# Patient Record
Sex: Male | Born: 1970 | Race: Black or African American | Hispanic: No | Smoking: Never smoker
Health system: Southern US, Community
[De-identification: ages and names within clinical notes are randomized; demographics above are authoritative.]

## PROBLEM LIST (undated history)

## (undated) DIAGNOSIS — E119 Type 2 diabetes mellitus without complications: Secondary | ICD-10-CM

## (undated) HISTORY — PX: OTHER SURGICAL HISTORY: SHX169

---

## 2015-10-06 ENCOUNTER — Emergency Department (HOSPITAL_COMMUNITY)
Admission: EM | Admit: 2015-10-06 | Discharge: 2015-10-07 | Disposition: A | Discharge status: Home / Self Care | Payer: Worker's Compensation | Attending: Emergency Medicine | Admitting: Emergency Medicine

## 2015-10-06 ENCOUNTER — Encounter (HOSPITAL_COMMUNITY): Payer: Self-pay | Admitting: *Deleted

## 2015-10-06 DIAGNOSIS — M545 Low back pain: Secondary | ICD-10-CM

## 2015-10-06 DIAGNOSIS — S8991XA Unspecified injury of right lower leg, initial encounter: Secondary | ICD-10-CM | POA: Insufficient documentation

## 2015-10-06 DIAGNOSIS — S4991XA Unspecified injury of right shoulder and upper arm, initial encounter: Secondary | ICD-10-CM | POA: Insufficient documentation

## 2015-10-06 DIAGNOSIS — Y9389 Activity, other specified: Secondary | ICD-10-CM | POA: Insufficient documentation

## 2015-10-06 DIAGNOSIS — E119 Type 2 diabetes mellitus without complications: Secondary | ICD-10-CM | POA: Diagnosis not present

## 2015-10-06 DIAGNOSIS — Y990 Civilian activity done for income or pay: Secondary | ICD-10-CM | POA: Diagnosis not present

## 2015-10-06 DIAGNOSIS — W010XXA Fall on same level from slipping, tripping and stumbling without subsequent striking against object, initial encounter: Secondary | ICD-10-CM | POA: Diagnosis not present

## 2015-10-06 DIAGNOSIS — W19XXXA Unspecified fall, initial encounter: Secondary | ICD-10-CM

## 2015-10-06 DIAGNOSIS — S3992XA Unspecified injury of lower back, initial encounter: Secondary | ICD-10-CM | POA: Insufficient documentation

## 2015-10-06 DIAGNOSIS — Z880 Allergy status to penicillin: Secondary | ICD-10-CM | POA: Insufficient documentation

## 2015-10-06 DIAGNOSIS — Y9289 Other specified places as the place of occurrence of the external cause: Secondary | ICD-10-CM | POA: Insufficient documentation

## 2015-10-06 DIAGNOSIS — M25511 Pain in right shoulder: Secondary | ICD-10-CM

## 2015-10-06 DIAGNOSIS — M25561 Pain in right knee: Secondary | ICD-10-CM

## 2015-10-06 HISTORY — DX: Type 2 diabetes mellitus without complications: E11.9

## 2015-10-06 MED ORDER — IBUPROFEN 400 MG PO TABS
800.0000 mg | ORAL_TABLET | Freq: Once | ORAL | Status: AC
  Administered 2015-10-06: 800 mg via ORAL
  Filled 2015-10-06: qty 2

## 2015-10-06 NOTE — ED Notes (Signed)
Patient stated he slipped on a wet floor at work, grabbed the swinging door with his right hand pulling his shoulder, hit his right knee and c/o lower back pain.  Denies LOC

## 2015-10-06 NOTE — ED Provider Notes (Signed)
CSN: [id number]     Arrival date & time 10/06/15  2250 History  By signing my name below, I, Rosario AdieWilliam Andrew Hiatt, attest that this documentation has been prepared under the direction and in the presence of Cheri FowlerKayla Iniko Robles, PA-C.   Electronically Signed: Rosario AdieWilliam Andrew Hiatt, ED Scribe. 10/06/2015. 11:56 PM.  Chief Complaint  Patient presents with  . Shoulder Pain  . Knee Pain  . Back Pain   The history is provided by the patient. No language interpreter was used.   HPI Comments: Charles Flowers is a 45 y.o. male with a PMHx of DM who presents to the Emergency Department complaining of gradually worsening R shoulder, R knee, and lower back pain s/p fall that occurred 3 hours PTA. Pt states that he fell backwards on a wet floor and tried to catch himself on a stall door and felt a "rip" in his shoulder, and landed on his R knee. No LOC, no head injury. Pt denies numbness or weakness, urinary or bowel incontinence. No OTC medications or home remedies tried PTA.   Past Medical History  Diagnosis Date  . Diabetes mellitus without complication [facility](HCC)    Past Surgical History  Procedure Laterality Date  . Wilms tumor     No family history on file. Social History  Substance Use Topics  . Smoking status: Never Smoker   . Smokeless tobacco: Never Used  . Alcohol Use: No    Review of Systems  Musculoskeletal: Positive for myalgias, back pain and arthralgias.  Neurological: Negative for syncope, weakness and numbness.  All other systems reviewed and are negative.  Allergies  Penicillins  Home Medications   Prior to Admission medications   Medication Sig Start Date End Date Taking? Authorizing Provider  ibuprofen (ADVIL,MOTRIN) 800 MG tablet Take 1 tablet (800 mg total) by mouth 3 (three) times daily. 10/07/15   Aliena Ghrist, PA-C   BP 148/91 mmHg  Pulse 81  Temp(Src) 98 F (36.7 C) (Oral)  Resp 18  Ht 5\' 9"  (1.753 m)  Wt 83.598 kg  BMI 27.20 kg/m2  SpO2 100%   Physical Exam   Constitutional: He is oriented to person, place, and time. He appears well-developed and well-nourished.  Non-toxic appearance. He does not have a sickly appearance. He does not appear ill.  HENT:  Head: Normocephalic and atraumatic.  Mouth/Throat: Oropharynx is clear and moist.  Eyes: Conjunctivae are normal. Pupils are equal, round, and reactive to light.  Neck: Normal range of motion. Neck supple.  No c/t midline tenderness.   Cardiovascular: Normal rate and regular rhythm.   Pulmonary/Chest: Effort normal and breath sounds normal. No accessory muscle usage or stridor. No respiratory distress. He has no wheezes. He has no rhonchi. He has no rales.  Abdominal: Soft. Bowel sounds are normal. He exhibits no distension. There is no tenderness. There is no rebound and no guarding.  Musculoskeletal: Normal range of motion.  No lumbar midline tenderness. FAROM of lumbar spine.  Right shoulder without obvious deformity or swelling.  TTP along AC joint and distal clavicle.  FAROM of shoulder. Right knee with mild tenderness.  FAROM.  No obvious deformity or swelling.   Lymphadenopathy:    He has no cervical adenopathy.  Neurological: He is alert and oriented to person, place, and time.  Speech clear without dysarthria.  Sensation and strength intact bilaterally throughout lower extremities. Gait normal.   Skin: Skin is warm and dry.  Psychiatric: He has a normal mood and affect. His behavior is  normal.    ED Course  Procedures (including critical care time)  DIAGNOSTIC STUDIES: Oxygen Saturation is 100% on RA, normal by my interpretation.   COORDINATION OF CARE: 11:55 PM-Discussed next steps with pt including DG R knee, DG lower back, DG R shoulder, and pain management medications. Pt verbalized understanding and is agreeable with the plan.   Imaging Review Dg Lumbar Spine Complete  10/07/2015  CLINICAL DATA:  Initial evaluation for acute trauma, fall. EXAM: LUMBAR SPINE - COMPLETE 4+  VIEW COMPARISON:  None. FINDINGS: Five non rib-bearing lumbar type vertebral bodies are present. Vertebral bodies are normally aligned with preservation of the normal lumbar lordosis paravertebral body heights preserved. No fracture or malalignment. Visualized sacrum intact. Mild degenerative intervertebral disc space narrowing present at L4-5 and L5-S1. Degenerative facet arthrosis present at L4-5 and L5-S1. No acute soft tissue abnormality. Surgical clips overlie the right lower quadrant. IMPRESSION: 1. No acute fracture or subluxation within the lumbar spine. 2. 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 Electronically Signed   By: Rise Mu M.D.   On: 10/07/2015 00:54   Dg Shoulder Right  10/07/2015  CLINICAL DATA:  Initial evaluation for acute trauma, fall. EXAM: RIGHT SHOULDER - 2+ VIEW COMPARISON:  None. FINDINGS: No acute fracture or dislocation. Humeral head in normal alignment with the glenoid. AC joint approximated. No periarticular calcification. Osseous mineralization normal. The partially visualized right hemi thorax without acute abnormality. IMPRESSION: No acute osseous abnormality about the shoulder. Electronically Signed   By: Rise Mu M.D.   On: 10/07/2015 00:50   Dg Knee Complete 4 Views Right  10/07/2015  CLINICAL DATA:  Initial evaluation for acute trauma, fall. EXAM: RIGHT KNEE - COMPLETE 4+ VIEW COMPARISON:  None. FINDINGS: No acute fracture dislocation. No significant joint effusion. Mild moderate tricompartmental degenerative osteoarthrosis. Scatter chondrocalcinosis noted. Scattered vascular calcifications within the leak. No significant soft tissue swelling. Osseous mineralization normal. IMPRESSION: 1. No acute fracture or dislocation. 2. 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 Electronically Signed   By: Rise Mu M.D.   On: 10/07/2015 00:52    I have personally reviewed and evaluated these images  as part of my medical decision-making.   MDM   Final diagnoses:  Fall, initial encounter  Right shoulder pain  Low back pain without sciatica, unspecified back pain laterality  Right knee pain   Patient presents with right shoulder, low back, and right knee pain after fall from standing today.  VSS, NAD.  Neurovascularly intact.  No back pain red flags.  Plain films negative for acute abnormality.  Ibuprofen for pain.  Follow up [facility]. Discussed return precautions.  Patient agrees and acknowledges the above plan for discharge.   I personally performed the services described in this documentation, which was scribed in my presence. The recorded information has been reviewed and is accurate.     Cheri Fowler, PA-C 10/07/15 0149  Layla Maw Ward, DO 10/07/15 1610

## 2015-10-07 ENCOUNTER — Emergency Department (HOSPITAL_COMMUNITY): Payer: Worker's Compensation

## 2015-10-07 ENCOUNTER — Ambulatory Visit (INDEPENDENT_AMBULATORY_CARE_PROVIDER_SITE_OTHER): Payer: Worker's Compensation | Admitting: Physician Assistant

## 2015-10-07 VITALS — BP 134/84 | HR 104 | Temp 98.0°F | Resp 17 | Ht 68.5 in | Wt 183.0 lb

## 2015-10-07 DIAGNOSIS — S46911D Strain of unspecified muscle, fascia and tendon at shoulder and upper arm level, right arm, subsequent encounter: Secondary | ICD-10-CM

## 2015-10-07 DIAGNOSIS — M545 Low back pain: Secondary | ICD-10-CM | POA: Diagnosis not present

## 2015-10-07 DIAGNOSIS — M25561 Pain in right knee: Secondary | ICD-10-CM | POA: Diagnosis not present

## 2015-10-07 DIAGNOSIS — W19XXXD Unspecified fall, subsequent encounter: Secondary | ICD-10-CM | POA: Diagnosis not present

## 2015-10-07 MED ORDER — IBUPROFEN 800 MG PO TABS: 800.0000 mg | ORAL_TABLET | Freq: Three times a day (TID) | ORAL | Status: DC

## 2015-10-07 NOTE — Discharge Instructions (Signed)

## 2015-10-07 NOTE — Patient Instructions (Addendum)
ICE 3 TIMES DAILY STRETCH 3 TIMES DAILY Shoulder Range of Motion Exercises Shoulder range of motion (ROM) exercises are designed to keep the shoulder moving freely. They are often recommended for people who have shoulder pain. MOVEMENT EXERCISE When you are able, do this exercise 5-6 days per week, or as told by your health care provider. Work toward doing 2 sets of 10 swings. Pendulum Exercise How To Do This Exercise Lying Down 1. Lie face-down on a bed with your abdomen close to the side of the bed. 2. Let your arm hang over the side of the bed. 3. Relax your shoulder, arm, and hand. 4. Slowly and gently swing your arm forward and back. Do not use your neck muscles to swing your arm. They should be relaxed. If you are struggling to swing your arm, have someone gently swing it for you. When you do this exercise for the first time, swing your arm at a 15 degree angle for 15 seconds, or swing your arm 10 times. As pain lessens over time, increase the angle of the swing to 30-45 degrees. 5. Repeat steps 1-4 with the other arm. How To Do This Exercise While Standing 1. Stand next to a sturdy chair or table and hold on to it with your hand.  Bend forward at the waist.  Bend your knees slightly.  Relax your other arm and let it hang limp.  Relax the shoulder blade of the arm that is hanging and let it drop.  While keeping your shoulder relaxed, use body motion to swing your arm in small circles. The first time you do this exercise, swing your arm for about 30 seconds or 10 times. When you do it next time, swing your arm for a little longer.  Stand up tall and relax.  Repeat steps 1-7, this time changing the direction of the circles. 2. Repeat steps 1-8 with the other arm. STRETCHING EXERCISES Do these exercises 3-4 times per day on 5-6 days per week or as told by your health care provider. Work toward holding the stretch for 20 seconds. Stretching Exercise 1 1. Lift your arm straight out  in front of you. 2. Bend your arm 90 degrees at the elbow (right angle) so your forearm goes across your body and looks like the letter "L." 3. Use your other arm to gently pull the elbow forward and across your body. 4. Repeat steps 1-3 with the other arm. Stretching Exercise 2 You will need a towel or rope for this exercise. 1. Bend one arm behind your back with the palm facing outward. 2. Hold a towel with your other hand. 3. Reach the arm that holds the towel above your head, and bend that arm at the elbow. Your wrist should be behind your neck. 4. Use your free hand to grab the free end of the towel. 5. With the higher hand, gently pull the towel up behind you. 6. With the lower hand, pull the towel down behind you. 7. Repeat steps 1-6 with the other arm. STRENGTHENING EXERCISES Do each of these exercises at four different times of day (sessions) every day or as told by your health care provider. To begin with, repeat each exercise 5 times (repetitions). Work toward doing 3 sets of 12 repetitions or as told by your health care provider. Strengthening Exercise 1 You will need a light weight for this activity. As you grow stronger, you may use a heavier weight. 1. Standing with a weight in your hand,  lift your arm straight out to the side until it is at the same height as your shoulder. 2. Bend your arm at 90 degrees so that your fingers are pointing to the ceiling. 3. Slowly raise your hand until your arm is straight up in the air. 4. Repeat steps 1-3 with the other arm. Strengthening Exercise 2 You will need a light weight for this activity. As you grow stronger, you may use a heavier weight. 1. Standing with a weight in your hand, gradually move your straight arm in an arc, starting at your side, then out in front of you, then straight up over your head. 2. Gradually move your other arm in an arc, starting at your side, then out in front of you, then straight up over your  head. 3. Repeat steps 1-2 with the other arm. Strengthening Exercise 3 You will need an elastic band for this activity. As you grow stronger, gradually increase the size of the bands or increase the number of bands that you use at one time. 1. While standing, hold an elastic band in one hand and raise that arm up in the air. 2. With your other hand, pull down the band until that hand is by your side. 3. Repeat steps 1-2 with the other arm.   This information is not intended to replace advice given to you by your health care provider. Make sure you discuss any questions you have with your health care provider.   Document Released: 01/15/2003 Document Revised: 09/02/2014 Document Reviewed: 04/14/2014 Elsevier Interactive Patient Education 2016 [employer]   Back Exercises The following exercises strengthen the muscles that help to support the back. They also help to keep the lower back flexible. Doing these exercises can help to prevent back pain or lessen existing pain. If you have back pain or discomfort, try doing these exercises 2-3 times each day or as told by your health care provider. When the pain goes away, do them once each day, but increase the number of times that you repeat the steps for each exercise (do more repetitions). If you do not have back pain or discomfort, do these exercises once each day or as told by your health care provider. EXERCISES Single Knee to Chest Repeat these steps 3-5 times for each leg: 6. Lie on your back on a firm bed or the floor with your legs extended. 7. Bring one knee to your chest. Your other leg should stay extended and in contact with the floor. 8. Hold your knee in place by grabbing your knee or thigh. 9. Pull on your knee until you feel a gentle stretch in your lower back. 10. Hold the stretch for 10-30 seconds. 11. Slowly release and straighten your leg. Pelvic Tilt Repeat these steps 5-10 times: 3. Lie on your back on a firm bed or the  floor with your legs extended. 4. Bend your knees so they are pointing toward the ceiling and your feet are flat on the floor. 5. Tighten your lower abdominal muscles to press your lower back against the floor. This motion will tilt your pelvis so your tailbone points up toward the ceiling instead of pointing to your feet or the floor. 6. With gentle tension and even breathing, hold this position for 5-10 seconds. Cat-Cow Repeat these steps until your lower back becomes more flexible: 5. Get into a hands-and-knees position on a firm surface. Keep your hands under your shoulders, and keep your knees under your hips. You may place  padding under your knees for comfort. 6. Let your head hang down, and point your tailbone toward the floor so your lower back becomes rounded like the back of a cat. 7. Hold this position for 5 seconds. 8. Slowly lift your head and point your tailbone up toward the ceiling so your back forms a sagging arch like the back of a cow. 9. Hold this position for 5 seconds. Press-Ups Repeat these steps 5-10 times: 8. Lie on your abdomen (face-down) on the floor. 9. Place your palms near your head, about shoulder-width apart. 10. While you keep your back as relaxed as possible and keep your hips on the floor, slowly straighten your arms to raise the top half of your body and lift your shoulders. Do not use your back muscles to raise your upper torso. You may adjust the placement of your hands to make yourself more comfortable. 11. Hold this position for 5 seconds while you keep your back relaxed. 12. Slowly return to lying flat on the floor. Bridges Repeat these steps 10 times: 5. Lie on your back on a firm surface. 6. Bend your knees so they are pointing toward the ceiling and your feet are flat on the floor. 7. Tighten your buttocks muscles and lift your buttocks off of the floor until your waist is at almost the same height as your knees. You should feel the muscles working  in your buttocks and the back of your thighs. If you do not feel these muscles, slide your feet 1-2 inches farther away from your buttocks. 8. Hold this position for 3-5 seconds. 9. Slowly lower your hips to the starting position, and allow your buttocks muscles to relax completely. If this exercise is too easy, try doing it with your arms crossed over your chest. Abdominal Crunches Repeat these steps 5-10 times: 4. Lie on your back on a firm bed or the floor with your legs extended. 5. Bend your knees so they are pointing toward the ceiling and your feet are flat on the floor. 6. Cross your arms over your chest. 7. Tip your chin slightly toward your chest without bending your neck. 8. Tighten your abdominal muscles and slowly raise your trunk (torso) high enough to lift your shoulder blades a tiny bit off of the floor. Avoid raising your torso higher than that, because it can put too much stress on your low back and it does not help to strengthen your abdominal muscles. 9. Slowly return to your starting position. Back Lifts Repeat these steps 5-10 times: 4. Lie on your abdomen (face-down) with your arms at your sides, and rest your forehead on the floor. 5. Tighten the muscles in your legs and your buttocks. 6. Slowly lift your chest off of the floor while you keep your hips pressed to the floor. Keep the back of your head in line with the curve in your back. Your eyes should be looking at the floor. 7. Hold this position for 3-5 seconds. 8. Slowly return to your starting position. SEEK MEDICAL CARE IF:  Your back pain or discomfort gets much worse when you do an exercise.  Your back pain or discomfort does not lessen within 2 hours after you exercise. If you have any of these problems, stop doing these exercises right away. Do not do them again unless your health care provider says that you can. SEEK IMMEDIATE MEDICAL CARE IF:  You develop sudden, severe back pain. If this happens, stop  doing the exercises right away.  Do not do them again unless your health care provider says that you can.   This information is not intended to replace advice given to you by your health care provider. Make sure you discuss any questions you have with your health care provider.   Document Released: 05/26/2004 Document Revised: 01/07/2015 Document Reviewed: 06/12/2014 Elsevier Interactive Patient Education 2016 [employer].      IF you received an x-ray today, you will receive an invoice from [facility]. Please contact [facility] at [phone] with questions or concerns regarding your invoice.   IF you received labwork today, you will receive an invoice from [employer]. Please contact [facility] at [phone] with questions or concerns regarding your invoice.   Our billing staff will not be able to assist you with questions regarding bills from these companies.  You will be contacted with the lab results as soon as they are available. The fastest way to get your results is to activate your My Chart account. Instructions are located on the last page of this paperwork. If you have not heard from [place] regarding the results in 2 weeks, please contact this office.

## 2015-10-07 NOTE — ED Notes (Signed)
Ice applied to R shoulder  

## 2015-10-07 NOTE — ED Notes (Signed)
Patient verbalized understanding of discharge instructions and denies any further needs or questions at this time. VS stable. Patient ambulatory with steady gait, declined wheelchair.  

## 2015-10-07 NOTE — ED Notes (Signed)
PA-C to see and assess patient before RN assessment. See PA note. 

## 2015-10-26 NOTE — Progress Notes (Signed)
Charles Flowers 1970-11-01 45 y.o.   Chief Complaint  Patient presents with  . Shoulder Pain    right   . Knee Pain  . Back Pain     Date of Injury: 10/06/2015  History of Present Illness:  Presents for evaluation of work-related complaint.  Patient is here for follow up and form information with a cc of right shoulder, right knee, and back pain after a fall backwards.  He slipped on a wet floor and attempted to reach for a door, and felt his shoulder pull.  He went to the ED yesterday and treated with ibuprofen.  He was also given complete restriction from work by the ED. Patient reports that he continues to have shoulder pain, right knee pain, and right low back pain.  There is no radiating numbness or tingling.  Shoulder pain without weakness.  He is able to move the shoulder though very painful.  More at the back of the shoulder.  He also has knee pain to the lateral anterior right knee.  Localized pain.    ROS ROS otherwise unremarkable unless listed above.   Allergies  Allergen Reactions  . Penicillins     Told not to take      Current medications reviewed and updated. Past medical history, family history, social history have been reviewed and updated.   Physical Exam  Constitutional: He is well-developed, well-nourished, and in no distress. No distress.  Eyes: Conjunctivae are normal. Pupils are equal, round, and reactive to light.  Cardiovascular: Normal rate.   Pulmonary/Chest: Effort normal. No respiratory distress. He has no wheezes.  Musculoskeletal:       Right shoulder: He exhibits decreased range of motion (external rotation with pain incited to the posteroir shoulder) and tenderness (right scapula). He exhibits no swelling, no pain and no spasm.       Right knee: He exhibits normal range of motion, no swelling, no LCL laxity, normal patellar mobility and no MCL laxity. Tenderness (anterior just superior to medial patella) found. No medial joint line, no  lateral joint line and no patellar tendon tenderness noted.       Lumbar back: He exhibits tenderness (spinous lumbar tenderness with adjacent musculature of right side.). He exhibits normal range of motion, no swelling and no spasm.  Skin: He is not diaphoretic.     Assessment and Plan: 45 year old male is here today for right shoulder and knee pain, and right low back pain.   We will keep the restrictions suggested by the ED.  Advised to rtc in 6 days for follow up as instructed in letter. Fall, subsequent encounter  Shoulder strain, right, subsequent encounter  Right knee pain  Right low back pain, with sciatica presence unspecified  Charles PlattStephanie English, PA-C [facility] 6/26/201710:25 AM

## 2016-03-10 ENCOUNTER — Encounter (HOSPITAL_COMMUNITY): Payer: Self-pay | Admitting: *Deleted

## 2016-03-10 ENCOUNTER — Emergency Department (HOSPITAL_COMMUNITY)
Admission: EM | Admit: 2016-03-10 | Discharge: 2016-03-10 | Disposition: A | Discharge status: Home / Self Care | Payer: 59 | Attending: Emergency Medicine | Admitting: Emergency Medicine

## 2016-03-10 ENCOUNTER — Emergency Department (HOSPITAL_COMMUNITY): Payer: 59

## 2016-03-10 DIAGNOSIS — Z79899 Other long term (current) drug therapy: Secondary | ICD-10-CM | POA: Insufficient documentation

## 2016-03-10 DIAGNOSIS — E119 Type 2 diabetes mellitus without complications: Secondary | ICD-10-CM | POA: Insufficient documentation

## 2016-03-10 DIAGNOSIS — J40 Bronchitis, not specified as acute or chronic: Secondary | ICD-10-CM | POA: Insufficient documentation

## 2016-03-10 LAB — I-STAT TROPONIN, ED
TROPONIN I, POC: 0.01 ng/mL (ref 0.00–0.08)
TROPONIN I, POC: 0 ng/mL (ref 0.00–0.08)

## 2016-03-10 LAB — BASIC METABOLIC PANEL
ANION GAP: 9 (ref 5–15)
BUN: 20 mg/dL (ref 6–20)
CALCIUM: 9.1 mg/dL (ref 8.9–10.3)
CHLORIDE: 103 mmol/L (ref 101–111)
CO2: 24 mmol/L (ref 22–32)
Creatinine, Ser: 1.4 mg/dL — ABNORMAL HIGH (ref 0.61–1.24)
GFR calc non Af Amer: 60 mL/min — ABNORMAL LOW (ref >60)
Glucose, Bld: 195 mg/dL — ABNORMAL HIGH (ref 65–99)
Potassium: 4.5 mmol/L (ref 3.5–5.1)
Sodium: 136 mmol/L (ref 135–145)

## 2016-03-10 LAB — CBC
HCT: 36.2 % — ABNORMAL LOW (ref 39.0–52.0)
HEMOGLOBIN: 12.1 g/dL — AB (ref 13.0–17.0)
MCH: 27.4 pg (ref 26.0–34.0)
MCHC: 33.4 g/dL (ref 30.0–36.0)
MCV: 81.9 fL (ref 78.0–100.0)
Platelets: 156 10*3/uL (ref 150–400)
RBC: 4.42 MIL/uL (ref 4.22–5.81)
RDW: 12.4 % (ref 11.5–15.5)
WBC: 3.9 10*3/uL — AB (ref 4.0–10.5)

## 2016-03-10 MED ORDER — PREDNISONE 20 MG PO TABS: ORAL_TABLET | ORAL | 0 refills | Status: DC

## 2016-03-10 MED ORDER — ALBUTEROL SULFATE HFA 108 (90 BASE) MCG/ACT IN AERS
2.0000 | INHALATION_SPRAY | RESPIRATORY_TRACT | Status: DC | PRN
  Administered 2016-03-10: 2 via RESPIRATORY_TRACT
  Filled 2016-03-10: qty 6.7

## 2016-03-10 MED ORDER — AEROCHAMBER PLUS W/MASK MISC
1.0000 | Freq: Once | Status: AC
Start: 2016-03-10 — End: 2016-03-10
  Administered 2016-03-10: 1
  Filled 2016-03-10: qty 1

## 2016-03-10 NOTE — ED Notes (Signed)
Pt ambulated in hallway with pulse ox with no complaints of SOB/dizziness/pain. O2 sats 100% on RA with ambulation.

## 2016-03-10 NOTE — ED Notes (Signed)
Phlebotomy at bedside.

## 2016-03-10 NOTE — ED Provider Notes (Signed)
[facility]-EMERGENCY DEPT Provider Note   CSN: [id number] Arrival date & time: 03/10/16  1049     History   Chief Complaint Chief Complaint  Patient presents with  . Chest Pain    HPI Charles Flowers is a 45 y.o. male.  HPI   45 year old male with history of diabetes presenting to the ED with complaints of chest pain. Patient states after he got off of work at 2 AM last night he notice an acute onset of chest tightness which has been intermittent but has not fully resolved since. Symptoms more prominent when he take a deep breath. He went to sleep, woke up and noticed that the pain still there. Pain is primarily to his upper right chest. He denies any associated lightheadedness, dizziness, diaphoresis, nausea. No radicular pain. He denies any fever, chills, headache, back pain, abdominal pain, focal numbness or weakness, or rash. Denies any recent strenuous activities or heavy lifting. No prior history of PE or DVT, no recent surgery, prolonged bed rest, unilateral leg swelling or calf pain, active cancer or hemoptysis. He is an insulin-dependent diabetes have been compliant with his medication. He denies any tobacco or alcohol use and no recreational drug use. Stress level is the same.  Past Medical History:  Diagnosis Date  . Diabetes mellitus without complication (HCC)     There are no active problems to display for this patient.   Past Surgical History:  Procedure Laterality Date  . wilms tumor         Home Medications    Prior to Admission medications   Medication Sig Start Date End Date Taking? Authorizing Provider  gabapentin (NEURONTIN) 300 MG capsule Take 300 mg by mouth as needed (pain).   Yes Historical Provider, MD  lisinopril (PRINIVIL,ZESTRIL) 2.5 MG tablet Take 2.5 mg by mouth daily.   Yes Historical Provider, MD  ibuprofen (ADVIL,MOTRIN) 800 MG tablet Take 1 tablet (800 mg total) by mouth 3 (three) times daily. Patient not taking: Reported on 03/10/2016 10/07/15    Cheri FowlerKayla Rose, PA-C    Family History No family history on file.  Social History Social History  Substance Use Topics  . Smoking status: Never Smoker  . Smokeless tobacco: Never Used  . Alcohol use No     Allergies   Penicillins   Review of Systems Review of Systems  All other systems reviewed and are negative.    Physical Exam Updated Vital Signs BP 154/82   Pulse 91   Temp 98.2 F (36.8 C) (Oral)   Resp 16   Ht 5\' 9"  (1.753 m)   Wt 87.1 kg   SpO2 100%   BMI 28.35 kg/m   Physical Exam  Constitutional: He is oriented to person, place, and time. He appears well-developed and well-nourished. No distress.  HENT:  Head: Atraumatic.  Eyes: Conjunctivae are normal.  Neck: Neck supple. No JVD present.  Cardiovascular: Normal rate, regular rhythm and intact distal pulses.   Pulmonary/Chest: Effort normal and breath sounds normal. No respiratory distress. He has no wheezes. He exhibits tenderness (Mild tenderness to right upper chest on palpation without crepitus or emphysema. No overlying skin changes.).  Abdominal: Soft. He exhibits no distension. There is no tenderness.  Musculoskeletal: Normal range of motion. He exhibits no edema.  Bilateral lower extremities without palpable cords, erythema, or edema.  Neurological: He is alert and oriented to person, place, and time.  Skin: No rash noted.  Psychiatric: He has a normal mood and affect.  Nursing note and  vitals reviewed.    ED Treatments / Results  Labs (all labs ordered are listed, but only abnormal results are displayed) Labs Reviewed  BASIC METABOLIC PANEL - Abnormal; Notable for the following:       Result Value   Glucose, Bld 195 (*)    Creatinine, Ser 1.40 (*)    GFR calc non Af Amer 60 (*)    All other components within normal limits  CBC - Abnormal; Notable for the following:    WBC 3.9 (*)    Hemoglobin 12.1 (*)    HCT 36.2 (*)    All other components within normal limits  I-STAT TROPOININ, ED    I-STAT TROPOININ, ED    EKG  EKG Interpretation  Date/Time:  Thursday March 10 2016 11:09:17 EST Ventricular Rate:  68 PR Interval:  154 QRS Duration: 76 QT Interval:  358 QTC Calculation: 380 R Axis:   79 Text Interpretation:  Normal sinus rhythm Normal ECG Confirmed by MESNER MD, Barbara CowerJASON [phone]([id number]) on 03/10/2016 12:41:31 PM       Radiology Dg Chest 2 View  Result Date: 03/10/2016 CLINICAL DATA:  Right-sided chest pain and tightness since earlier today with intermittent shortness of breath. No cardiopulmonary history. Nonsmoker. History of diabetes. EXAM: CHEST  2 VIEW COMPARISON:  None in PACs FINDINGS: The right hemidiaphragm is mildly elevated. The interstitial markings are both lungs are coarse. There is no alveolar infiltrate or pleural effusion. The heart and pulmonary vascularity are normal. The mediastinum is normal in width. The bony thorax exhibits no acute abnormality. IMPRESSION: Bronchitic changes either acute or chronic. No pneumonia nor CHF. No pneumothorax. Mild right hemidiaphragm elevation. Electronically Signed   By: David  SwazilandJordan M.D.   On: 03/10/2016 11:53    Procedures Procedures (including critical care time)  Medications Ordered in ED Medications  albuterol (PROVENTIL HFA;VENTOLIN HFA) 108 (90 Base) MCG/ACT inhaler 2 puff (2 puffs Inhalation Given 03/10/16 1451)  aerochamber plus with mask device 1 each (1 each Other Given 03/10/16 1451)     Initial Impression / Assessment and Plan / ED Course  I have reviewed the triage vital signs and the nursing notes.  Pertinent labs & imaging results that were available during my care of the patient were reviewed by me and considered in my medical decision making (see chart for details).  Clinical Course    BP 145/90   Pulse 66   Temp 98.2 F (36.8 C) (Oral)   Resp 23   Ht 5\' 9"  (1.753 m)   Wt 87.1 kg   SpO2 99%   BMI 28.35 kg/m    Final Clinical Impressions(s) / ED Diagnoses   Final diagnoses:   Bronchitis    New Prescriptions New Prescriptions   PREDNISONE (DELTASONE) 20 MG TABLET    2 tabs po daily x 4 days   2:26 PM Patient here with reproducible right upper chest wall tenderness atypical for ACS. PERC negative, doubt PE.   3:58 PM EKG without acute ischemic changes. Delta troponin is negative. Labs are reassuring. Chest x-ray with signs of suspect bronchitis. Patient given albuterol inhaler which provide some relief of his chest pressure. He will be discharged with a course of steroid, albuterol inhaler to use as needed. Return precaution discussed. Work note provided as requested.   Fayrene HelperBowie Tysean Vandervliet, PA-C 03/10/16 1600    Arby BarretteMarcy Pfeiffer, MD 03/15/16 [phone]

## 2016-03-10 NOTE — Discharge Instructions (Signed)
Your chest tightness is likely due to bronchitis.  Use albuterol inhaler 2 puffs every 4 hours as needed  Take steroid as prescribed.  Return to the ER if your condition worsen or if you have other concerns

## 2016-03-10 NOTE — ED Triage Notes (Signed)
Pt reports chest pain that started at 2am after getting off work. Pt states that pain continued and woke him from sleep. Pt is upper rt chest. Denies associated symptoms.

## 2016-10-07 ENCOUNTER — Emergency Department (HOSPITAL_COMMUNITY)
Admission: EM | Admit: 2016-10-07 | Discharge: 2016-10-08 | Disposition: A | Discharge status: Home / Self Care | Payer: Worker's Compensation | Attending: Emergency Medicine | Admitting: Emergency Medicine

## 2016-10-07 ENCOUNTER — Encounter (HOSPITAL_COMMUNITY): Payer: Self-pay

## 2016-10-07 DIAGNOSIS — Y929 Unspecified place or not applicable: Secondary | ICD-10-CM | POA: Diagnosis not present

## 2016-10-07 DIAGNOSIS — E119 Type 2 diabetes mellitus without complications: Secondary | ICD-10-CM | POA: Insufficient documentation

## 2016-10-07 DIAGNOSIS — X500XXA Overexertion from strenuous movement or load, initial encounter: Secondary | ICD-10-CM | POA: Diagnosis not present

## 2016-10-07 DIAGNOSIS — Y990 Civilian activity done for income or pay: Secondary | ICD-10-CM | POA: Diagnosis not present

## 2016-10-07 DIAGNOSIS — Z79899 Other long term (current) drug therapy: Secondary | ICD-10-CM | POA: Insufficient documentation

## 2016-10-07 DIAGNOSIS — M545 Low back pain, unspecified: Secondary | ICD-10-CM

## 2016-10-07 DIAGNOSIS — Y9389 Activity, other specified: Secondary | ICD-10-CM | POA: Diagnosis not present

## 2016-10-07 NOTE — ED Triage Notes (Addendum)
Pt reports 8/10 Lower back pain that began while at work this evening. Pt denies recent traumatic injury or fall. Pt A+OX4, speaking in complete sentences, ambulatory to triage. Pt also reports he would like Workers Comp completed.

## 2016-10-08 MED ORDER — METHOCARBAMOL 500 MG PO TABS: 500.0000 mg | ORAL_TABLET | Freq: Two times a day (BID) | ORAL | 0 refills | Status: DC

## 2016-10-08 MED ORDER — NAPROXEN 500 MG PO TABS: 500.0000 mg | ORAL_TABLET | Freq: Two times a day (BID) | ORAL | 0 refills | Status: DC

## 2016-10-08 MED ORDER — NAPROXEN 500 MG PO TABS
500.0000 mg | ORAL_TABLET | Freq: Once | ORAL | Status: AC
  Administered 2016-10-08: 500 mg via ORAL
  Filled 2016-10-08: qty 1

## 2016-10-08 NOTE — ED Provider Notes (Signed)
[facility]-EMERGENCY DEPT Provider Note   CSN: [id number] Arrival date & time: 10/07/16  2336     History   Chief Complaint Chief Complaint  Patient presents with  . Back Pain    HPI Roger Kettles is a 46 y.o. male.  HPI   Patient is a 46 year old male with history of diabetes who presents to the ED with complaint of low back pain. Patient reports earlier this evening while he was at work he was lifting a heavy carpet roller another coworker and he states after he first tried to lift the object he had sudden onset of cramping spasming pain across his lower back. Patient reports having constant pain since the injury and notes pain is worse with movement or bending. Denies taking any medications prior to arrival. Pt denies fever, numbness, tingling, saddle anesthesia, loss of bowel or bladder, weakness, IVDU, cancer or recent spinal manipulation. Denies history of chronic back pain or previous back injuries. Patient denies any recent fall.  Past Medical History:  Diagnosis Date  . Diabetes mellitus without complication (HCC)     There are no active problems to display for this patient.   Past Surgical History:  Procedure Laterality Date  . wilms tumor         Home Medications    Prior to Admission medications   Medication Sig Start Date End Date Taking? Authorizing Provider  gabapentin (NEURONTIN) 300 MG capsule Take 300 mg by mouth as needed (pain).    [provider]  ibuprofen (ADVIL,MOTRIN) 800 MG tablet Take 1 tablet (800 mg total) by mouth 3 (three) times daily. Patient not taking: Reported on 03/10/2016 10/07/15   Cheri Fowler, PA-C  lisinopril (PRINIVIL,ZESTRIL) 2.5 MG tablet Take 2.5 mg by mouth daily.    [provider]  methocarbamol (ROBAXIN) 500 MG tablet Take 1 tablet (500 mg total) by mouth 2 (two) times daily. 10/08/16   Barrett Henle, PA-C  naproxen (NAPROSYN) 500 MG tablet Take 1 tablet (500 mg total) by mouth 2 (two) times daily.  10/08/16   Barrett Henle, PA-C  predniSONE (DELTASONE) 20 MG tablet 2 tabs po daily x 4 days 03/10/16   Fayrene Helper, PA-C    Family History History reviewed. No pertinent family history.  Social History Social History  Substance Use Topics  . Smoking status: Never Smoker  . Smokeless tobacco: Never Used  . Alcohol use No     Allergies   Penicillins   Review of Systems Review of Systems  Musculoskeletal: Positive for back pain.  All other systems reviewed and are negative.    Physical Exam Updated Vital Signs BP (!) 166/87 (BP Location: Left Arm)   Pulse 71   Temp 98.1 F (36.7 C) (Oral)   Resp 16   Ht 5\' 9"  (1.753 m)   Wt 96.6 kg (212 lb 15.4 oz)   SpO2 98%   BMI 31.45 kg/m   Physical Exam  Constitutional: He is oriented to person, place, and time. He appears well-developed and well-nourished. No distress.  HENT:  Head: Normocephalic and atraumatic.  Eyes: Conjunctivae and EOM are normal. Right eye exhibits no discharge. Left eye exhibits no discharge. No scleral icterus.  Neck: Normal range of motion. Neck supple.  Cardiovascular: Normal rate, regular rhythm, normal heart sounds and intact distal pulses.   Pulmonary/Chest: Effort normal and breath sounds normal. No respiratory distress. He has no wheezes. He has no rales. He exhibits no tenderness.  Abdominal: Soft. He exhibits no distension. There  is no tenderness.  Musculoskeletal: Normal range of motion. He exhibits tenderness. He exhibits no edema or deformity.  No midline C, T, or L tenderness. Mild TTP over bilateral lumbar paraspinal muscles. Full range of motion of neck and back. Full range of motion of bilateral upper and lower extremities, with 5/5 strength. Sensation intact. 2+ radial and PT pulses. Cap refill <2 seconds. Patient able to stand and ambulate without assistance.    Neurological: He is alert and oriented to person, place, and time. He has normal strength. He displays normal reflexes.  No sensory deficit.  Skin: Skin is warm and dry. He is not diaphoretic.  Nursing note and vitals reviewed.    ED Treatments / Results  Labs (all labs ordered are listed, but only abnormal results are displayed) Labs Reviewed - No data to display  EKG  EKG Interpretation None       Radiology No results found.  Procedures Procedures (including critical care time)  Medications Ordered in ED Medications  naproxen (NAPROSYN) tablet 500 mg (not administered)     Initial Impression / Assessment and Plan / ED Course  I have reviewed the triage vital signs and the nursing notes.  Pertinent labs & imaging results that were available during my care of the patient were reviewed by me and considered in my medical decision making (see chart for details).     Patient with back pain after lifting heavy object at work earlier this evening.  No neurological deficits and normal neuro exam. Mild TTP over lumbar paraspinal muscles. No midlines spinal tenderness. VSS. Patient can walk but states is painful.  No loss of bowel or bladder control.  No concern for cauda equina.  No fever, night sweats, weight loss, h/o cancer, IVDU.  RICE protocol and pain medicine indicated and discussed with patient.  Advised patient to follow up with PCP as needed. Discussed return precautions.  Final Clinical Impressions(s) / ED Diagnoses   Final diagnoses:  Acute bilateral low back pain without sciatica    New Prescriptions New Prescriptions   METHOCARBAMOL (ROBAXIN) 500 MG TABLET    Take 1 tablet (500 mg total) by mouth 2 (two) times daily.   NAPROXEN (NAPROSYN) 500 MG TABLET    Take 1 tablet (500 mg total) by mouth 2 (two) times daily.     Barrett Henleadeau, Nicole Elizabeth, PA-C 10/08/16 0015    Dione BoozeGlick, David, MD 10/08/16 [phone]

## 2016-10-08 NOTE — Discharge Instructions (Signed)
Take your medications as prescribed. You may also apply ice and/or heat to affected area for 15-20 minutes 3-4 times daily for additional pain relief. I recommend refraining from doing any heavy lifting, squatting or repetitive movements that exacerbate her symptoms for the next few days. °Follow-up with your primary care provider in the next week if her symptoms have not improved. °Please return to the Emergency Department if symptoms worsen or new onset of denies fever, numbness, tingling, groin anesthesia, loss of bowel or bladder, weakness, chest pain, abdominal pain, vomiting. °

## 2017-02-02 ENCOUNTER — Emergency Department (HOSPITAL_COMMUNITY): Payer: Self-pay

## 2017-02-02 ENCOUNTER — Encounter (HOSPITAL_COMMUNITY): Payer: Self-pay | Admitting: Emergency Medicine

## 2017-02-02 ENCOUNTER — Observation Stay (HOSPITAL_COMMUNITY)
Admission: EM | Admit: 2017-02-02 | Discharge: 2017-02-03 | Disposition: A | Discharge status: Home / Self Care | Payer: Self-pay | Attending: Internal Medicine | Admitting: Internal Medicine

## 2017-02-02 ENCOUNTER — Observation Stay (HOSPITAL_COMMUNITY): Payer: Self-pay

## 2017-02-02 DIAGNOSIS — E1122 Type 2 diabetes mellitus with diabetic chronic kidney disease: Secondary | ICD-10-CM

## 2017-02-02 DIAGNOSIS — I130 Hypertensive heart and chronic kidney disease with heart failure and stage 1 through stage 4 chronic kidney disease, or unspecified chronic kidney disease: Secondary | ICD-10-CM | POA: Insufficient documentation

## 2017-02-02 DIAGNOSIS — G459 Transient cerebral ischemic attack, unspecified: Principal | ICD-10-CM | POA: Diagnosis present

## 2017-02-02 DIAGNOSIS — I5032 Chronic diastolic (congestive) heart failure: Secondary | ICD-10-CM | POA: Insufficient documentation

## 2017-02-02 DIAGNOSIS — Z794 Long term (current) use of insulin: Secondary | ICD-10-CM | POA: Insufficient documentation

## 2017-02-02 DIAGNOSIS — Z85528 Personal history of other malignant neoplasm of kidney: Secondary | ICD-10-CM | POA: Insufficient documentation

## 2017-02-02 DIAGNOSIS — Z880 Allergy status to penicillin: Secondary | ICD-10-CM | POA: Insufficient documentation

## 2017-02-02 DIAGNOSIS — N179 Acute kidney failure, unspecified: Secondary | ICD-10-CM | POA: Insufficient documentation

## 2017-02-02 DIAGNOSIS — N183 Chronic kidney disease, stage 3 unspecified: Secondary | ICD-10-CM

## 2017-02-02 DIAGNOSIS — E785 Hyperlipidemia, unspecified: Secondary | ICD-10-CM

## 2017-02-02 DIAGNOSIS — I43 Cardiomyopathy in diseases classified elsewhere: Secondary | ICD-10-CM | POA: Insufficient documentation

## 2017-02-02 LAB — ECHOCARDIOGRAM COMPLETE
Height: 69 in
WEIGHTICAEL: 3548.52 [oz_av]

## 2017-02-02 LAB — CREATININE, SERUM
Creatinine, Ser: 1.92 mg/dL — ABNORMAL HIGH (ref 0.61–1.24)
GFR calc non Af Amer: 41 mL/min — ABNORMAL LOW (ref >60)
GFR, EST AFRICAN AMERICAN: 47 mL/min — AB (ref >60)

## 2017-02-02 LAB — URINALYSIS, ROUTINE W REFLEX MICROSCOPIC
BILIRUBIN URINE: NEGATIVE
Glucose, UA: >=500 mg/dL — AB
KETONES UR: NEGATIVE mg/dL
LEUKOCYTES UA: NEGATIVE
NITRITE: NEGATIVE
Protein, ur: >=300 mg/dL — AB
SPECIFIC GRAVITY, URINE: 1.014 (ref 1.005–1.030)
SQUAMOUS EPITHELIAL / LPF: NONE SEEN
pH: 5 (ref 5.0–8.0)

## 2017-02-02 LAB — BASIC METABOLIC PANEL
ANION GAP: 7 (ref 5–15)
BUN: 20 mg/dL (ref 6–20)
CO2: 27 mmol/L (ref 22–32)
Calcium: 8.6 mg/dL — ABNORMAL LOW (ref 8.9–10.3)
Chloride: 103 mmol/L (ref 101–111)
Creatinine, Ser: 2.11 mg/dL — ABNORMAL HIGH (ref 0.61–1.24)
GFR calc non Af Amer: 36 mL/min — ABNORMAL LOW (ref >60)
GFR, EST AFRICAN AMERICAN: 42 mL/min — AB (ref >60)
Glucose, Bld: 257 mg/dL — ABNORMAL HIGH (ref 65–99)
Potassium: 4 mmol/L (ref 3.5–5.1)
SODIUM: 137 mmol/L (ref 135–145)

## 2017-02-02 LAB — I-STAT TROPONIN, ED: TROPONIN I, POC: 0.03 ng/mL (ref 0.00–0.08)

## 2017-02-02 LAB — CBC
HEMATOCRIT: 39.7 % (ref 39.0–52.0)
Hemoglobin: 13.5 g/dL (ref 13.0–17.0)
MCH: 27.6 pg (ref 26.0–34.0)
MCHC: 34 g/dL (ref 30.0–36.0)
MCV: 81 fL (ref 78.0–100.0)
PLATELETS: 159 10*3/uL (ref 150–400)
RBC: 4.9 MIL/uL (ref 4.22–5.81)
RDW: 12.3 % (ref 11.5–15.5)
WBC: 4.5 10*3/uL (ref 4.0–10.5)

## 2017-02-02 LAB — CBC WITH DIFFERENTIAL/PLATELET
BASOS PCT: 0 %
Basophils Absolute: 0 10*3/uL (ref 0.0–0.1)
EOS ABS: 0.2 10*3/uL (ref 0.0–0.7)
Eosinophils Relative: 5 %
HCT: 36.3 % — ABNORMAL LOW (ref 39.0–52.0)
HEMOGLOBIN: 12.3 g/dL — AB (ref 13.0–17.0)
LYMPHS ABS: 1.9 10*3/uL (ref 0.7–4.0)
Lymphocytes Relative: 44 %
MCH: 26.8 pg (ref 26.0–34.0)
MCHC: 33.9 g/dL (ref 30.0–36.0)
MCV: 79.1 fL (ref 78.0–100.0)
Monocytes Absolute: 0.3 10*3/uL (ref 0.1–1.0)
Monocytes Relative: 8 %
Neutro Abs: 1.8 10*3/uL (ref 1.7–7.7)
Neutrophils Relative %: 43 %
Platelets: 164 10*3/uL (ref 150–400)
RBC: 4.59 MIL/uL (ref 4.22–5.81)
RDW: 12.3 % (ref 11.5–15.5)
WBC: 4.2 10*3/uL (ref 4.0–10.5)

## 2017-02-02 LAB — GLUCOSE, CAPILLARY
GLUCOSE-CAPILLARY: 209 mg/dL — AB (ref 65–99)
Glucose-Capillary: 234 mg/dL — ABNORMAL HIGH (ref 65–99)
Glucose-Capillary: 225 mg/dL — ABNORMAL HIGH (ref 65–99)

## 2017-02-02 LAB — LACTATE DEHYDROGENASE: LDH: 206 U/L — AB (ref 98–192)

## 2017-02-02 MED ORDER — STROKE: EARLY STAGES OF RECOVERY BOOK
Freq: Once | Status: DC
  Filled 2017-02-02: qty 1

## 2017-02-02 MED ORDER — SODIUM CHLORIDE 0.9 % IV SOLN: INTRAVENOUS | Status: DC

## 2017-02-02 MED ORDER — ACETAMINOPHEN 160 MG/5ML PO SOLN: 650.0000 mg | ORAL | Status: DC | PRN

## 2017-02-02 MED ORDER — ENOXAPARIN SODIUM 40 MG/0.4ML ~~LOC~~ SOLN
40.0000 mg | SUBCUTANEOUS | Status: DC
  Administered 2017-02-02: 40 mg via SUBCUTANEOUS
  Filled 2017-02-02 (×2): qty 0.4

## 2017-02-02 MED ORDER — SENNOSIDES-DOCUSATE SODIUM 8.6-50 MG PO TABS: 1.0000 | ORAL_TABLET | Freq: Every evening | ORAL | Status: DC | PRN

## 2017-02-02 MED ORDER — FUROSEMIDE 10 MG/ML IJ SOLN
40.0000 mg | Freq: Once | INTRAMUSCULAR | Status: AC
  Administered 2017-02-02: 40 mg via INTRAVENOUS
  Filled 2017-02-02: qty 4

## 2017-02-02 MED ORDER — HYDRALAZINE HCL 25 MG PO TABS
25.0000 mg | ORAL_TABLET | Freq: Four times a day (QID) | ORAL | Status: DC | PRN
  Administered 2017-02-02 – 2017-02-03 (×2): 25 mg via ORAL
  Filled 2017-02-02 (×2): qty 1

## 2017-02-02 MED ORDER — AMLODIPINE BESYLATE 10 MG PO TABS
10.0000 mg | ORAL_TABLET | Freq: Every day | ORAL | Status: DC
  Administered 2017-02-02 – 2017-02-03 (×2): 10 mg via ORAL
  Filled 2017-02-02 (×2): qty 1

## 2017-02-02 MED ORDER — ACETAMINOPHEN 500 MG PO TABS
1000.0000 mg | ORAL_TABLET | Freq: Once | ORAL | Status: AC
  Administered 2017-02-02: 1000 mg via ORAL
  Filled 2017-02-02: qty 2

## 2017-02-02 MED ORDER — ACETAMINOPHEN 325 MG PO TABS: 650.0000 mg | ORAL_TABLET | ORAL | Status: DC | PRN

## 2017-02-02 MED ORDER — INSULIN ASPART 100 UNIT/ML ~~LOC~~ SOLN
0.0000 [IU] | Freq: Three times a day (TID) | SUBCUTANEOUS | Status: DC
  Administered 2017-02-02 – 2017-02-03 (×3): 3 [IU] via SUBCUTANEOUS
  Administered 2017-02-03: 7 [IU] via SUBCUTANEOUS

## 2017-02-02 MED ORDER — ACETAMINOPHEN 650 MG RE SUPP: 650.0000 mg | RECTAL | Status: DC | PRN

## 2017-02-02 MED ORDER — ASPIRIN EC 81 MG PO TBEC
81.0000 mg | DELAYED_RELEASE_TABLET | Freq: Every day | ORAL | Status: DC
  Administered 2017-02-02 – 2017-02-03 (×2): 81 mg via ORAL
  Filled 2017-02-02 (×2): qty 1

## 2017-02-02 NOTE — ED Notes (Signed)
EKG given to EDP,Nanavati,MD., for review. 

## 2017-02-02 NOTE — ED Provider Notes (Signed)
[facility]-EMERGENCY DEPT Provider Note   CSN: [id number] Arrival date & time: 02/02/17  0229     History   Chief Complaint Chief Complaint  Patient presents with  . Hypertension    HPI Charles Flowers is a 46 y.o. male.  HPI Patient with history of diabetes comes in with chief complaint of headache and altered mental status. Patient reports that when he left work at 12:30 AM he started noticing some right-sided weakness. Patient was unable to use his car keys properly and kept on dropping his cup. Patient then noted some numbness to his face, and as he was entering his home he felt weak in his legs (not sure which ones). Patient decided to call 911, however he could not remember how to use his own phone. Patient is now having a headache that is frontal. Patient has no neck pain. All the symptoms that he was experiencing have now resolved. Patient denies any cigarette smoking, drug use, alcohol use.Patient is noted to be hypertensive with blood pressure of 183/108 at arrival, he reports no diagnosis of hypertension.  Past Medical History:  Diagnosis Date  . Diabetes mellitus without complication (HCC)     There are no active problems to display for this patient.   Past Surgical History:  Procedure Laterality Date  . wilms tumor         Home Medications    Prior to Admission medications   Not on File    Family History No family history on file.  Social History Social History  Substance Use Topics  . Smoking status: Never Smoker  . Smokeless tobacco: Never Used  . Alcohol use No     Allergies   Penicillins   Review of Systems Review of Systems  Constitutional: Positive for activity change.  Eyes: Positive for visual disturbance.  Neurological: Positive for weakness, numbness and headaches. Negative for speech difficulty.  All other systems reviewed and are negative.    Physical Exam Updated Vital Signs BP (!) 177/103   Pulse 75   Temp 97.8 F (36.6 C)  (Oral)   Resp (!) 22   SpO2 95%   Physical Exam  Constitutional: He is oriented to person, place, and time. He appears well-developed.  HENT:  Head: Atraumatic.  Neck: Neck supple.  Cardiovascular: Normal rate.   Pulmonary/Chest: Effort normal.  Neurological: He is alert and oriented to person, place, and time. No cranial nerve deficit. Coordination normal.  Cerebellar exam is normal (finger to nose) Sensory exam normal for bilateral upper and lower extremities - and patient is able to discriminate between sharp and dull. Motor exam is 4+/5   Skin: Skin is warm.  Nursing note and vitals reviewed.    ED Treatments / Results  Labs (all labs ordered are listed, but only abnormal results are displayed) Labs Reviewed  CBC WITH DIFFERENTIAL/PLATELET - Abnormal; Notable for the following:       Result Value   Hemoglobin 12.3 (*)    HCT 36.3 (*)    All other components within normal limits  BASIC METABOLIC PANEL - Abnormal; Notable for the following:    Glucose, Bld 257 (*)    Creatinine, Ser 2.11 (*)    Calcium 8.6 (*)    GFR calc non Af Amer 36 (*)    GFR calc Af Amer 42 (*)    All other components within normal limits  I-STAT TROPONIN, ED    EKG  EKG Interpretation None       Radiology Ct  Head Wo Contrast  Result Date: 02/02/2017 CLINICAL DATA:  46 year old male with a history of bilateral leg weakness and dizziness EXAM: CT HEAD WITHOUT CONTRAST TECHNIQUE: Contiguous axial images were obtained from the base of the skull through the vertex without intravenous contrast. COMPARISON:  None. FINDINGS: Brain: No acute intracranial hemorrhage. No midline shift or mass effect. Gray-white differentiation maintained. Unremarkable appearance of the ventricular system. Vascular: Unremarkable. Skull: No acute fracture.  No aggressive bone lesion identified. Sinuses/Orbits: Unremarkable appearance of the orbits. Mastoid air cells clear. No middle ear effusion. No significant sinus  disease. Other: None IMPRESSION: No CT evidence of acute intracranial abnormality. Electronically Signed   By: Gilmer Mor D.O.   On: 02/02/2017 07:31    Procedures Procedures (including critical care time)  Medications Ordered in ED Medications - No data to display   Initial Impression / Assessment and Plan / ED Course  I have reviewed the triage vital signs and the nursing notes.  Pertinent labs & imaging results that were available during my care of the patient were reviewed by me and considered in my medical decision making (see chart for details).  Clinical Course as of Feb 02 754  Thu Feb 02, 2017  4782 Discussed case with Dr. Laurence Slate, Neurology. We discussed patient's past medical history, history of present illness in my concerns for possible TIA. Neurologist recommends getting an MRI of the brain at this time. The MRI is negative patient can get outpatient TIA workup. His ABCD 2 score is low. He wants patient to be admitted at [facility] and once hospitalist team to consult neurology if there is evidence of stroke on the MRI.  [AN]    Clinical Course User Index [AN] Derwood Kaplan, MD   Patient comes into the ER with chief complaint of altered mental status, headaches. Patient has history of poorly controlled diabetes.He is also noted to be hypertensive and does not carry a diagnosis of high blood pressure. Patient has constellation of neurologic symptoms that he experienced 12:30 AM. Currently all of his neurologic symptoms have resolved. Although the symptoms he experienced did not reciprocate to specific vascular territory, I'm still concerned about TIA type event. Neurology has been consulted.   Patient also has elevated creatinine. Given the poorly controlled high blood pressure and diabetes in the setting of headaches, neurologic symptoms, elevated creatinine it probably is best to admit the patient for optimization of his co morbidities and evaluation for TIA  Final  Clinical Impressions(s) / ED Diagnoses   Final diagnoses:  TIA (transient ischemic attack)  AKI (acute kidney injury) [facility])    New Prescriptions New Prescriptions   No medications on file     Derwood Kaplan, MD 02/02/17 [phone]

## 2017-02-02 NOTE — H&P (Signed)
TRH H&P    Patient Demographics:    Charles Flowers, is a 46 y.o. male  MRN: [id number]  DOB - 1970-08-19  Admit Date - 02/02/2017  Referring MD/NP/PA: Dr Rhunette Croft  Outpatient Primary MD for the patient is Jaynie Bream, MD  Patient coming from: Home  Chief Complaint  Patient presents with  . Hypertension      HPI:    Charles Flowers  is a 46 y.o. male, With history of diabetes mellitus came to hospital with headache and brief episode of altered mental status. Patient says that he left work at 12:30 AM, and noticed numbness on his face, and also experienced some right-sided weakness and numbness. Patient says that he was unable to use his car keys properly and kept on dropping his cup. He called his cousin who told him to call 911. Patient says that he could not dial 911 as he was confused and was unable to use his phone. By the time EMS came, patient developed headache. When patient was brought to the ED his symptoms had resolved.  He denies any blurred vision no passing out. No seizure. He was found to have blood pressure of 192/110. He denies chest pain or shortness of breath. No nausea vomiting or diarrhea.  CT head was negative for stroke. Neurology was consulted by ED physician, and recommended to just get MRI brain. If it showed stroke then only stroke workup otherwise no stroke or TIA workup.  Patient has history of diabetes mellitus but does not take any medications. He is not aware of history of hypertension. Patient has no PCP, started a new job yesterday. Moved from [place] recently. He separated from his wife and has one child who does not live with him.    Review of systems:    In addition to the HPI above,    All other systems reviewed and are negative.   With Past History of the following :    Past Medical History:  Diagnosis Date  . Diabetes mellitus without complication  [facility])       Past Surgical History:  Procedure Laterality Date  . wilms tumor        Social History:      Social History  Substance Use Topics  . Smoking status: Never Smoker  . Smokeless tobacco: Never Used  . Alcohol use No       Family History :   No family history of heart disease or cancer   Home Medications:   Prior to Admission medications   Not on File     Allergies:     Allergies  Allergen Reactions  . Penicillins Other (See Comments)    Has patient had a PCN reaction causing immediate rash, facial/tongue/throat swelling, SOB or lightheadedness with hypotension: Unknown Has patient had a PCN reaction causing severe rash involving mucus membranes or skin necrosis: Unknown Has patient had a PCN reaction that required hospitalization: Unknown Has patient had a PCN reaction occurring within the last 10 years: No CHILDHOOD ALLERGY  If all of the  above answers are "NO", then may proceed with Cephalosporin use.      Physical Exam:   Vitals  Blood pressure (!) 168/100, pulse 73, temperature 97.8 F (36.6 C), temperature source Oral, resp. rate (!) 22, SpO2 100 %.  1.  General: Appears in no acute distress  2. Psychiatric:  Intact judgement and  insight, awake alert, oriented x 3.  3. Neurologic: No focal neurological deficits, all cranial nerves intact.Strength 5/5 all 4 extremities, sensation intact all 4 extremities, plantars down going.  4. Eyes :  anicteric sclerae, moist conjunctivae with no lid lag. PERRLA.  5. ENMT:  Oropharynx clear with moist mucous membranes and good dentition  6. Neck:  supple, no cervical lymphadenopathy appriciated, No thyromegaly  7. Respiratory : Normal respiratory effort, bibasilar crackles  8. Cardiovascular : RRR, no gallops, rubs or murmurs, bilateral 2+ pitting edema of the lower extremities  9. Gastrointestinal:  Positive bowel sounds, abdomen soft, non-tender to palpation,no hepatosplenomegaly, no  rigidity or guarding       10. Skin:  No cyanosis, normal texture and turgor, no rash, lesions or ulcers  11.Musculoskeletal:  Good muscle tone,  joints appear normal , no effusions,  normal range of motion    Data Review:    CBC  Recent Labs Lab 02/02/17 0251  WBC 4.2  HGB 12.3*  HCT 36.3*  PLT 164  MCV 79.1  MCH 26.8  MCHC 33.9  RDW 12.3  LYMPHSABS 1.9  MONOABS 0.3  EOSABS 0.2  BASOSABS 0.0   ------------------------------------------------------------------------------------------------------------------  Chemistries   Recent Labs Lab 02/02/17 0251  NA 137  K 4.0  CL 103  CO2 27  GLUCOSE 257*  BUN 20  CREATININE 2.11*  CALCIUM 8.6*   ------------------------------------------------------------------------------------------------------------------  ------------------------------------------------------------------------------------------------------------------ GFR: CrCl cannot be calculated (Unknown ideal weight.). Liver Function Tests: No results for input(s): AST, ALT, ALKPHOS, BILITOT, PROT, ALBUMIN in the last 168 hours. No results for input(s): LIPASE, AMYLASE in the last 168 hours. No results for input(s): AMMONIA in the last 168 hours. Coagulation Profile: No results for input(s): INR, PROTIME in the last 168 hours. Cardiac Enzymes: No results for input(s): CKTOTAL, CKMB, CKMBINDEX, TROPONINI in the last 168 hours. BNP (last 3 results) No results for input(s): PROBNP in the last 8760 hours. HbA1C: No results for input(s): HGBA1C in the last 72 hours. CBG: No results for input(s): GLUCAP in the last 168 hours. Lipid Profile: No results for input(s): CHOL, HDL, LDLCALC, TRIG, CHOLHDL, LDLDIRECT in the last 72 hours. Thyroid Function Tests: No results for input(s): TSH, T4TOTAL, FREET4, T3FREE, THYROIDAB in the last 72 hours. Anemia Panel: No results for input(s): VITAMINB12, FOLATE, FERRITIN, TIBC, IRON, RETICCTPCT in the last 72  hours.  --------------------------------------------------------------------------------------------------------------- Urine analysis: No results found for: COLORURINE, APPEARANCEUR, LABSPEC, PHURINE, GLUCOSEU, HGBUR, BILIRUBINUR, KETONESUR, PROTEINUR, UROBILINOGEN, NITRITE, LEUKOCYTESUR    Imaging Results:    Ct Head Wo Contrast  Result Date: 02/02/2017 CLINICAL DATA:  46 year old male with a history of bilateral leg weakness and dizziness EXAM: CT HEAD WITHOUT CONTRAST TECHNIQUE: Contiguous axial images were obtained from the base of the skull through the vertex without intravenous contrast. COMPARISON:  None. FINDINGS: Brain: No acute intracranial hemorrhage. No midline shift or mass effect. Gray-white differentiation maintained. Unremarkable appearance of the ventricular system. Vascular: Unremarkable. Skull: No acute fracture.  No aggressive bone lesion identified. Sinuses/Orbits: Unremarkable appearance of the orbits. Mastoid air cells clear. No middle ear effusion. No significant sinus disease. Other: None IMPRESSION: No CT evidence of acute intracranial  abnormality. Electronically Signed   By: Gilmer Mor D.O.   On: 02/02/2017 07:31   Mr Brain Wo Contrast  Result Date: 02/02/2017 CLINICAL DATA:  Altered level of consciousness. Bilateral leg weakness and dizziness since this morning EXAM: MRI HEAD WITHOUT CONTRAST TECHNIQUE: Multiplanar, multiecho pulse sequences of the brain and surrounding structures were obtained without intravenous contrast. COMPARISON:  Head CT 02/02/2017 FINDINGS: Brain: No acute infarction, hemorrhage, hydrocephalus, extra-axial collection or mass lesion. Asymmetric widening of left cerebellar fissure without suspected underlying infarct. Mild periventricular FLAIR hyperintensity, usually chronic small vessel ischemia; patient has history of diabetes. No specific demyelinating pattern. Vascular: Major vessels are patent Skull and upper cervical spine: Negative for  marrow lesion Sinuses/Orbits: Negative IMPRESSION: 1. No acute finding or explanation for symptoms. 2. Mild periventricular white matter disease, usually from chronic small vessel ischemia. Electronically Signed   By: Marnee Spring M.D.   On: 02/02/2017 09:50    My personal review of EKG: Rhythm NSR   Assessment & Plan:    Active Problems:   TIA (transient ischemic attack)   1. Altered mental status-brief episode, resolved likely from hypertensive encephalopathy. MRI is negative for stroke. I called and discussed with neurologist on call Dr. Wilford Corner, who does not recommend TIA workup at this time. Will monitor the patient on telemetry. Control blood pressure. Check echocardiogram, hemoglobin A1c, lipid profile.  2. 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 Blood pressure medications have to be added based on the response to Norvasc.  3. Acute kidney injury versus CKD stage III- patient's BUN is 20, creatinine is 2.11, previous creatinine 1.40 was from November 2017. Will check UA, LDH, haptoglobin. Follow BMP in am. He will need nephrology consultation as outpatient.  4. Bilateral lower extremity edema-? CHF,  patient has bilateral lower extremity edema with bibasilar crackles on lungs auscultation. Will give 1 dose of Lasix 40 mg IV 1. Follow BMP in a.m. Echocardiogram has been ordered.  5. Diabetes mellitus-blood glucose is elevated to 257, check hemoglobin A1c. Start sliding scale insulin with NovoLog. Will have to started on oral hypoglycemic or insulin before discharge.   DVT Prophylaxis-   Lovenox  AM Labs Ordered, also please review Full Orders  Family Communication: Admission, patients condition and plan of care including tests being ordered have been discussed with the patient  who indicate understanding and agree with the plan and Code Status.  Code Status:  Full code  Admission  status: Inpatient    Time spent in minutes : 60 minutes   Geniene List S M.D on 02/02/2017 at 10:32 AM  Between 7am to 7pm - Pager - [phone]. After 7pm go to [email or web address] - password [facility]  [facility] - Office  [phone]

## 2017-02-02 NOTE — ED Triage Notes (Signed)
Pt coming from home with complaints of bilateral leg weakness and dizziness. Hypertensive. No history of heart issues. No neuro deficits. A&O x 4.

## 2017-02-02 NOTE — Progress Notes (Signed)
  Echocardiogram 2D Echocardiogram has been performed.  Charles Flowers 02/02/2017, 2:40 PM

## 2017-02-02 NOTE — ED Notes (Signed)
Bed: RESA Expected date:  Expected time:  Means of arrival:  Comments: EMS 46 yo male bilateral leg weakness/dizziness 210/112

## 2017-02-02 NOTE — ED Notes (Signed)
Please call report to April at [phone] at [phone]. Charles Flowers

## 2017-02-03 ENCOUNTER — Telehealth: Payer: Self-pay

## 2017-02-03 DIAGNOSIS — E785 Hyperlipidemia, unspecified: Secondary | ICD-10-CM

## 2017-02-03 DIAGNOSIS — G459 Transient cerebral ischemic attack, unspecified: Secondary | ICD-10-CM

## 2017-02-03 DIAGNOSIS — N183 Chronic kidney disease, stage 3 unspecified: Secondary | ICD-10-CM

## 2017-02-03 DIAGNOSIS — Z794 Long term (current) use of insulin: Secondary | ICD-10-CM

## 2017-02-03 DIAGNOSIS — E1122 Type 2 diabetes mellitus with diabetic chronic kidney disease: Secondary | ICD-10-CM

## 2017-02-03 LAB — LIPID PANEL
CHOL/HDL RATIO: 5.2 ratio
Cholesterol: 257 mg/dL — ABNORMAL HIGH (ref 0–200)
HDL: 49 mg/dL (ref >40)
LDL CALC: 177 mg/dL — AB (ref 0–99)
Triglycerides: 155 mg/dL — ABNORMAL HIGH (ref <150)
VLDL: 31 mg/dL (ref 0–40)

## 2017-02-03 LAB — HEMOGLOBIN A1C
HEMOGLOBIN A1C: 9.8 % — AB (ref 4.8–5.6)
Mean Plasma Glucose: 234.56 mg/dL

## 2017-02-03 LAB — COMPREHENSIVE METABOLIC PANEL
ALBUMIN: 2.3 g/dL — AB (ref 3.5–5.0)
ALT: 15 U/L — ABNORMAL LOW (ref 17–63)
ANION GAP: 5 (ref 5–15)
AST: 17 U/L (ref 15–41)
Alkaline Phosphatase: 73 U/L (ref 38–126)
BUN: 20 mg/dL (ref 6–20)
CALCIUM: 8.4 mg/dL — AB (ref 8.9–10.3)
CHLORIDE: 103 mmol/L (ref 101–111)
CO2: 28 mmol/L (ref 22–32)
Creatinine, Ser: 1.85 mg/dL — ABNORMAL HIGH (ref 0.61–1.24)
GFR calc non Af Amer: 42 mL/min — ABNORMAL LOW (ref >60)
GFR, EST AFRICAN AMERICAN: 49 mL/min — AB (ref >60)
GLUCOSE: 310 mg/dL — AB (ref 65–99)
Potassium: 3.9 mmol/L (ref 3.5–5.1)
SODIUM: 136 mmol/L (ref 135–145)
Total Bilirubin: 0.5 mg/dL (ref 0.3–1.2)
Total Protein: 5.3 g/dL — ABNORMAL LOW (ref 6.5–8.1)

## 2017-02-03 LAB — GLUCOSE, CAPILLARY
GLUCOSE-CAPILLARY: 214 mg/dL — AB (ref 65–99)
Glucose-Capillary: 305 mg/dL — ABNORMAL HIGH (ref 65–99)

## 2017-02-03 LAB — HAPTOGLOBIN: Haptoglobin: 149 mg/dL (ref 34–200)

## 2017-02-03 LAB — HIV ANTIBODY (ROUTINE TESTING W REFLEX): HIV Screen 4th Generation wRfx: NONREACTIVE

## 2017-02-03 MED ORDER — ATORVASTATIN CALCIUM 40 MG PO TABS: 40.0000 mg | ORAL_TABLET | Freq: Every day | ORAL | 2 refills | Status: AC

## 2017-02-03 MED ORDER — LISINOPRIL 20 MG PO TABS: 20.0000 mg | ORAL_TABLET | Freq: Every day | ORAL | 2 refills | Status: AC

## 2017-02-03 MED ORDER — LIVING WELL WITH DIABETES BOOK
Freq: Once | Status: AC
  Administered 2017-02-03: 14:00:00
  Filled 2017-02-03: qty 1

## 2017-02-03 MED ORDER — METFORMIN HCL 1000 MG PO TABS: 1000.0000 mg | ORAL_TABLET | Freq: Every day | ORAL | 2 refills | Status: AC

## 2017-02-03 MED ORDER — AMLODIPINE BESYLATE 10 MG PO TABS: 10.0000 mg | ORAL_TABLET | Freq: Every day | ORAL | 1 refills | Status: AC

## 2017-02-03 MED ORDER — ASPIRIN 81 MG PO TBEC: 81.0000 mg | DELAYED_RELEASE_TABLET | Freq: Every day | ORAL | 1 refills | Status: AC

## 2017-02-03 MED ORDER — LIVING WELL WITH DIABETES BOOK: 1.0000 | Freq: Once | Status: AC

## 2017-02-03 NOTE — Care Management Note (Signed)
Case Management Note  Patient Details  Name: [name] MRN: [id number] Date of Birth: 1971/01/12  Subjective/Objective: 46 y/o m admitted w/TIA. From home. Patient w/no insurance,no pcp. Patient works. Contacted [facility] for pcp appt-they have no appts available, [facility], [facility] also have no appts available. Provided patient w/[facility] listing for pcp(patient will call on own for pcp appt) Patient encouraged to call before d/c today-patient voiced understanding of importance. Providede w/$4 [facility] med list. Patient states he can afford meds on the $4 [facility] med list. No further CM needs.                   Action/Plan:d/c plan home.   Expected Discharge Date:   (unknown)               Expected Discharge Plan:  Home/Self Care  In-House Referral:     Discharge planning Services  CM Consult, Indigent Health Clinic, Medication Assistance  Post Acute Care Choice:    Choice offered to:     DME Arranged:    DME Agency:     HH Arranged:    HH Agency:     Status of Service:  Completed, signed off  If discussed at [employer], dates discussed:    Additional Comments:  Lanier Clam, RN 02/03/2017, 11:35 AM

## 2017-02-03 NOTE — Progress Notes (Signed)
Inpatient Diabetes Program Recommendations  AACE/ADA: New Consensus Statement on Inpatient Glycemic Control (2015)  Target Ranges:  Prepandial:   less than 140 mg/dL      Peak postprandial:   less than 180 mg/dL (1-2 hours)      Critically ill patients:  140 - 180 mg/dL   Spoke with patient about diabetes and home regimen for diabetes control. Patient reports not being able to work out and eat right for 1 year. Spoke to patient about A1c level, Glucose and A1c goals. Patient is agreeable to taking  Metformin and checking his glucose until he can get use to a new routine. Patient just started another job and insurance kicks in within 30 days. Patient searching for carb counting apps. Discussed impact of nutrition, exercise, stress, sickness, and medications on diabetes control. Discussed carbohydrates, carbohydrate goals per day and meal, along with portion sizes.  Patient verbalized understanding of information discussed and he states that he has no further questions at this time related to diabetes.   Thanks,  Christena Deem RN, MSN, [facility] Inpatient Diabetes Coordinator Team Pager [phone] (8a-5p)

## 2017-02-03 NOTE — Progress Notes (Signed)
OT Cancellation Note  Patient Details Name: Charles Flowers MRN: [id number] DOB: Sep 06, 1970   Cancelled Treatment:    Reason Eval/Treat Not Completed: OT screened, no needs identified, will sign off.  Pt feels back to normal.  No numbness/weakness and he has  Been getting up to bathroom himself.  No visual change.  Will sign off.  Jiyaan Steinhauser 02/03/2017, 8:20 AM  Marica Otter, OTR/L [phone] 02/03/2017

## 2017-02-03 NOTE — Discharge Summary (Signed)
Physician Discharge Summary  Charles Flowers ZOX:[id number] DOB: 03/28/1971 DOA: 02/02/2017  PCP: Jaynie Bream, MD  Admit date: 02/02/2017 Discharge date: 02/03/2017  Time spent: 35 minutes  Recommendations for Outpatient Follow-up:  1. Repeat BMET to follow electrolytes and renal function  2. Reassess BP and adjust medications as needed 3. Close follow up of CBG's and A1C; adjust hypoglycemic regimen as needed 4. Repeat LFT's and lipid panel in 6 weeks; adjust hypolipemic regimen as needed.    Discharge Diagnoses:  Active Problems:   TIA (transient ischemic attack)   CKD (chronic kidney disease), stage III (HCC)   Type 2 diabetes mellitus with stage 3 chronic kidney disease, with long-term current use of insulin (HCC)   Hyperlipidemia   Discharge Condition: stable and improved. Discharge home with instructions to follow up with PCP and to take medications as prescribed.   Diet recommendation: heart healthy and modified carbohydrates diet.  Filed Weights   02/02/17 1139  Weight: 100.6 kg (221 lb 12.5 oz)    History of present illness:  46 y.o. male, With history of diabetes mellitus came to hospital with headache and brief episode of altered mental status. Patient says that he left work at 12:30 AM, and noticed numbness on his face, and also experienced some right-sided weakness. Patient says that he was unable to use his car keys properly and kept on dropping his cup. He called his cousin who told him to call 911. Patient says that he could not dial 911 as he was confused and was unable to use his phone. By the time EMS came, patient developed headache. When patient was brought to the ED his symptoms had resolved. Brought in for observation, further evaluation and treatment of TIA.  Hospital Course:  1-Hypertensive encephalopathy/TIA -patient MRI neg for acute stroke -symptoms resolved and not residual deficit appreciated. -2-D echo: with moderate LVH, normal EF and no  cardiac source of emboli appreciated. -patient with HTN, elevated LDL and also A!C on 9.8 as risk factors -will discharge on ASA for secondary prevention -encourage to be compliant with lifestyle changes and medication compliance for his risk factors conditions  2-HTN -better controlled at discharge -encourage to follow low sodium/heart healthy diet  -discharge on amlodipine and lisinopril therapy -needs outpatient follow up with PCP for further adjustments on antihypertensive regimen   3-HLD -discharge on Lipitor -LDL 177  4-type 2 diabetes with renal failure -patient discharge on metformin and advise to follow modified carbohydrates diet   5-CKD stage 3 -improved and essentially at baseline by GFR -needs close follow up of Cr trend especially with initiation of medications   6-chronic diastolic heart failure -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 Wall thickness was   increased in a pattern of moderate LVH. Systolic function was   normal. The estimated ejection fraction was in the range of 55%   to 60%. Wall motion was normal; there were no regional wall   motion abnormalities. Features are consistent with a pseudonormal   left ventricular filling pattern, with concomitant abnormal   relaxation and increased filling pressure (grade 2 diastolic   dysfunction). - Left atrium: The atrium was mildly to moderately dilated.  Impressions: - No cardiac source of emboli was indentified.  Consultations:  Neurology curbside  Discharge Exam: Vitals:   02/02/17 2117 02/03/17 0509  BP: (!) 167/76 (!) 164/83  Pulse: 71 69  Resp: 18 18  Temp: 98.4 F (36.9 C) 98.1 F (36.7 C)  SpO2:  100% 96%    General: afebrile, AAOX3, no focal motor or neurologic deficit appreciated Cardiovascular: S1 and S2, no rubs, no gallops, no murmur Respiratory: CTA bilaterally Abd: soft, NT, ND, positive BS\ Extremities: no edema, no cyanosis, no clubbing   Discharge Instructions   Discharge Instructions    Diet - low sodium heart healthy    Complete by:  As directed    Discharge instructions    Complete by:  As directed    Take medications as prescribed  Follow heart healthy diet and also low carbohydrates  Keep yourself well hydrated  Follow up with PCP as instructed     Current Discharge Medication List    START taking these medications   Details  amLODipine (NORVASC) 10 MG tablet Take 1 tablet (10 mg total) by mouth daily. Qty: 30 tablet, Refills: 1    aspirin EC 81 MG EC tablet Take 1 tablet (81 mg total) by mouth daily. Qty: 30 tablet, Refills: 1    atorvastatin (LIPITOR) 40 MG tablet Take 1 tablet (40 mg total) by mouth daily. Qty: 30 tablet, Refills: 2    lisinopril (PRINIVIL,ZESTRIL) 20 MG tablet Take 1 tablet (20 mg total) by mouth daily. Qty: 30 tablet, Refills: 2    living well with diabetes book MISC 1 each by Does not apply route once.    metFORMIN (GLUCOPHAGE) 1000 MG tablet Take 1 tablet (1,000 mg total) by mouth daily with breakfast. Qty: 30 tablet, Refills: 2       Allergies  Allergen Reactions  . Penicillins Other (See Comments)    Has patient had a PCN reaction causing immediate rash, facial/tongue/throat swelling, SOB or lightheadedness with hypotension: Unknown Has patient had a PCN reaction causing severe rash involving mucus membranes or skin necrosis: Unknown Has patient had a PCN reaction that required hospitalization: Unknown Has patient had a PCN reaction occurring within the last 10 years: No CHILDHOOD ALLERGY  If all of the above answers are "NO", then may proceed with Cephalosporin use.    Follow-up Information    [facility]. Schedule an appointment as soon as possible for a visit.            The results of significant diagnostics from this hospitalization (including imaging, microbiology, ancillary and laboratory) are listed below for reference.    Significant Diagnostic Studies: Ct Head Wo Contrast  Result Date: 02/02/2017 CLINICAL DATA:  46 year old male with a history of bilateral leg weakness and dizziness EXAM: CT HEAD WITHOUT CONTRAST TECHNIQUE: Contiguous axial images were obtained from the base of the skull through the vertex without intravenous contrast. COMPARISON:  None. FINDINGS: Brain: No acute intracranial hemorrhage. No midline shift or mass effect. Gray-white differentiation maintained. Unremarkable appearance of the ventricular system. Vascular: Unremarkable. Skull: No acute fracture.  No aggressive bone lesion identified. Sinuses/Orbits: Unremarkable appearance of the orbits. Mastoid air cells clear. No middle ear effusion. No significant sinus disease. Other: None IMPRESSION: No CT evidence of acute intracranial abnormality. Electronically Signed   By: Gilmer Mor D.O.   On: 02/02/2017 07:31   Mr Brain Wo Contrast  Result Date: 02/02/2017 CLINICAL DATA:  Altered level of consciousness. Bilateral leg weakness and dizziness since this morning EXAM: MRI HEAD WITHOUT CONTRAST TECHNIQUE: Multiplanar,  multiecho pulse sequences of the brain and surrounding structures were obtained without intravenous contrast. COMPARISON:  Head CT 02/02/2017 FINDINGS: Brain: No acute infarction, hemorrhage, hydrocephalus, extra-axial collection or mass lesion. Asymmetric widening of left cerebellar fissure without suspected underlying infarct. Mild periventricular FLAIR hyperintensity, usually chronic small vessel ischemia; patient has history of diabetes. No specific demyelinating pattern. Vascular: Major vessels are patent Skull and upper cervical spine: Negative for marrow lesion Sinuses/Orbits: Negative  IMPRESSION: 1. No acute finding or explanation for symptoms. 2. Mild periventricular white matter disease, usually from chronic small vessel ischemia. Electronically Signed   By: Marnee Spring M.D.   On: 02/02/2017 09:50    Microbiology: No results found for this or any previous visit (from the past 240 hour(s)).   Labs: Basic Metabolic Panel:  Recent Labs Lab 02/02/17 0251 02/02/17 1359 02/03/17 0414  NA 137  --  136  K 4.0  --  3.9  CL 103  --  103  CO2 27  --  28  GLUCOSE 257*  --  310*  BUN 20  --  20  CREATININE 2.11* 1.92* 1.85*  CALCIUM 8.6*  --  8.4*   Liver Function Tests:  Recent Labs Lab 02/03/17 0414  AST 17  ALT 15*  ALKPHOS 73  BILITOT 0.5  PROT 5.3*  ALBUMIN 2.3*   No results for input(s): LIPASE, AMYLASE in the last 168 hours. No results for input(s): AMMONIA in the last 168 hours. CBC:  Recent Labs Lab 02/02/17 0251 02/02/17 1359  WBC 4.2 4.5  NEUTROABS 1.8  --   HGB 12.3* 13.5  HCT 36.3* 39.7  MCV 79.1 81.0  PLT 164 159   CBG:  Recent Labs Lab 02/02/17 1246 02/02/17 1738 02/02/17 2113  GLUCAP 234* 225* 209*    Signed:  Vassie Loll MD.  [facility] 02/03/2017, 12:35 PM

## 2017-02-03 NOTE — Progress Notes (Signed)
Inpatient Diabetes Program Recommendations  AACE/ADA: New Consensus Statement on Inpatient Glycemic Control (2015)  Target Ranges:  Prepandial:   less than 140 mg/dL      Peak postprandial:   less than 180 mg/dL (1-2 hours)      Critically ill patients:  140 - 180 mg/dL   Results for [name], [name] (MRN [id number]) as of 02/03/2017 10:23  Ref. Range 02/02/2017 12:46 02/02/2017 17:38 02/02/2017 21:13  Glucose-Capillary Latest Ref Range: 65 - 99 mg/dL 409 (H) 811 (H) 914 (H)   Results for [name], [name] (MRN [id number]) as of 02/03/2017 10:23  Ref. Range 02/03/2017 04:14  Glucose Latest Ref Range: 65 - 99 mg/dL 086 (H)   Review of Glycemic Control  Diabetes history: DM 2 Outpatient Diabetes medications: None Current orders for Inpatient glycemic control: Novolog Sensitive Correction 0-9 units tid  Inpatient Diabetes Program Recommendations:    Glucose elevated this am. Consider adding low dose basal insulin Lantus 10 units and add Novolog HS scale.  Agree with MD that patient would possibly benefit from metformin at time of d/c.  Thanks,  Christena Deem RN, MSN, [facility] Inpatient Diabetes Coordinator Team Pager [phone] (8a-5p)

## 2017-02-03 NOTE — Telephone Encounter (Signed)
Message received from Lanier Clam, RN CM requesting a hospital follow up appointment for the patient at [facility]. Informed her that there are not any appointments available at this time.  The [facility] can try contacting the [facility].

## 2019-06-07 IMAGING — CT CT HEAD W/O CM
3 of 4 series · 16 of 47 positions shown, 19 images · non-contrast
Comparison: None.

CLINICAL DATA: 46-year-old male with a history of bilateral leg
weakness and dizziness

EXAM:
CT HEAD WITHOUT CONTRAST
TECHNIQUE: Contiguous axial images were obtained from the base of the skull
through the vertex without intravenous contrast.

[Series 2: head w/o · axial · non-contrast · 0.45mm/px · z∈[-142,-22]mm · 10 of 30 slices shown, 13 images]
[im 3/30  brain]
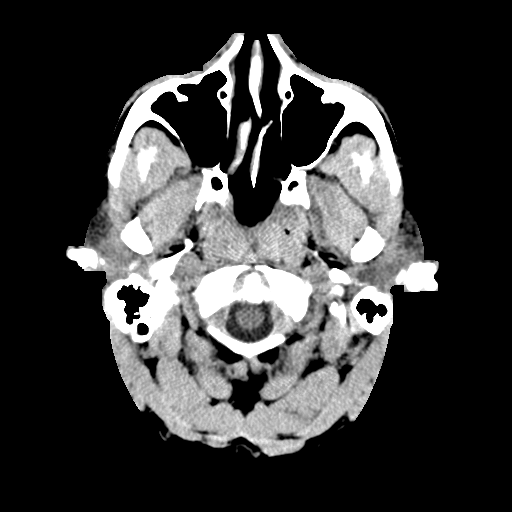
[im 3/30  bone]
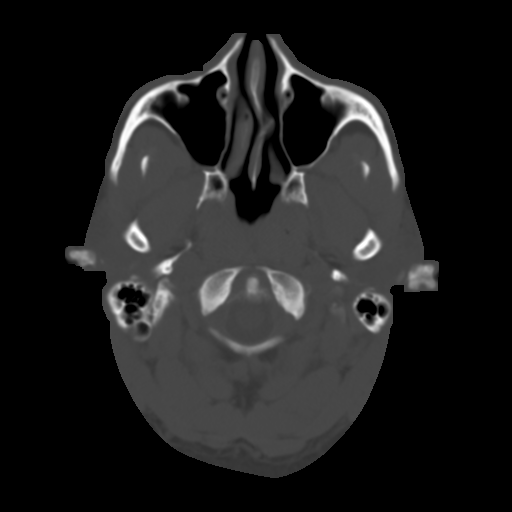
[im 5/30  brain]
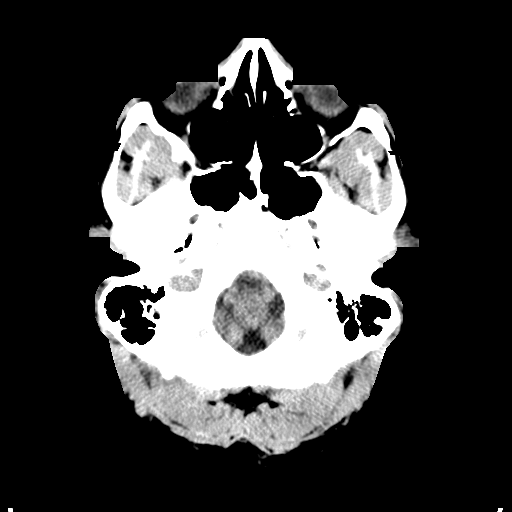
[im 9/30  brain]
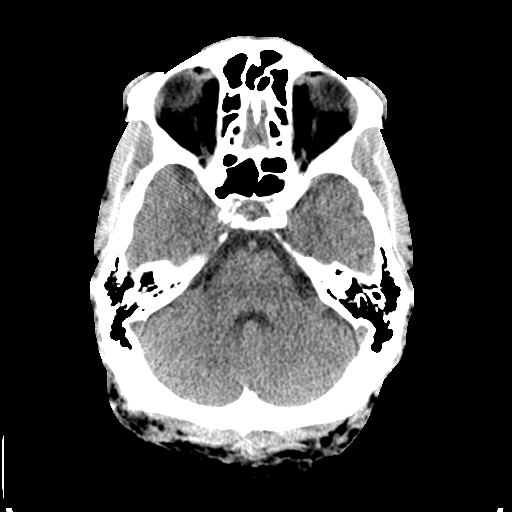
[im 11/30  brain]
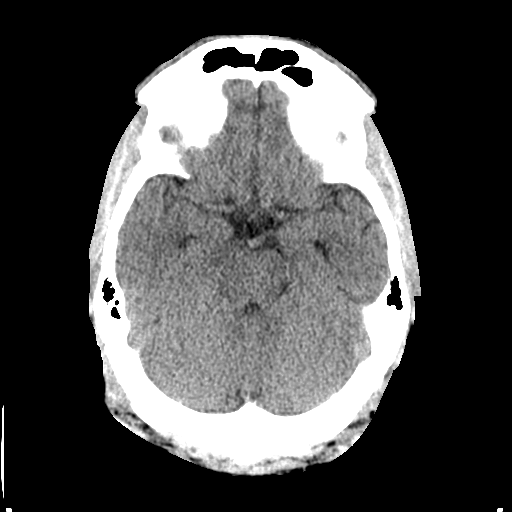
[im 13/30  brain]
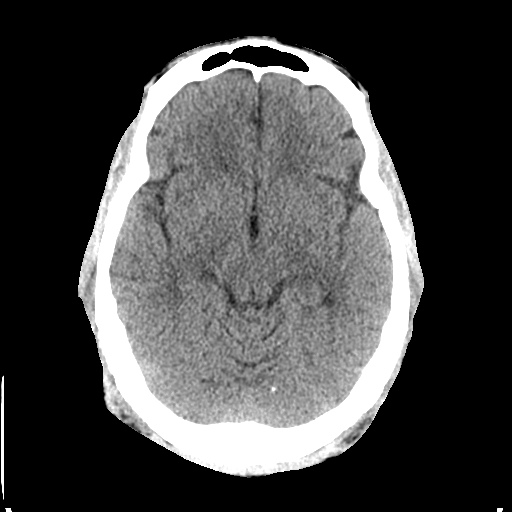
[im 13/30  bone]
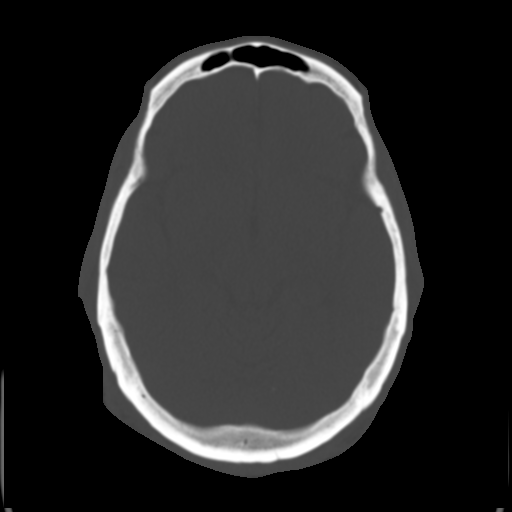
[im 17/30  brain]
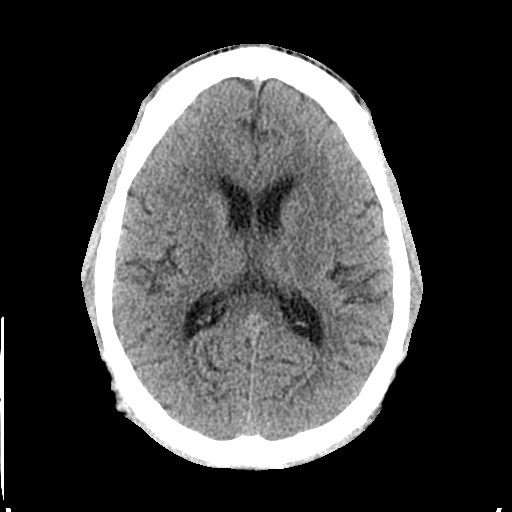
[im 19/30  brain]
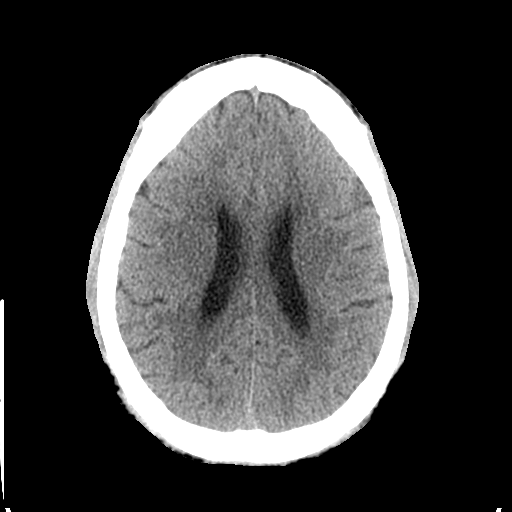
[im 21/30  brain]
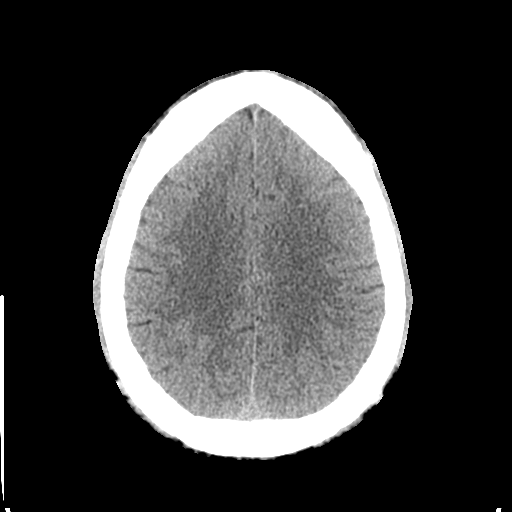
[im 25/30  brain]
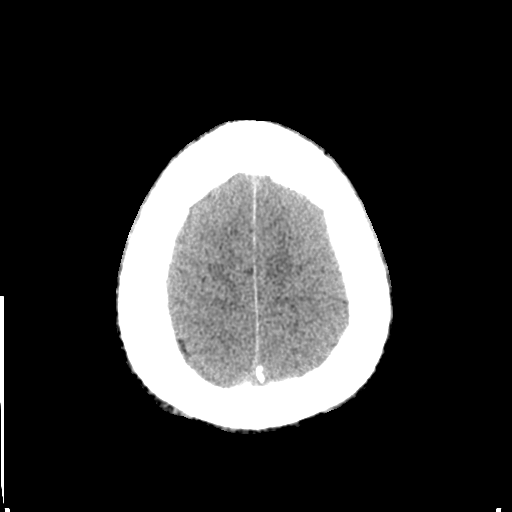
[im 25/30  bone]
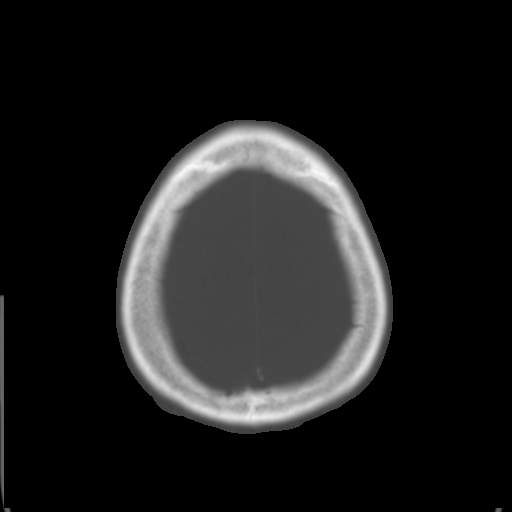
[im 27/30  brain]
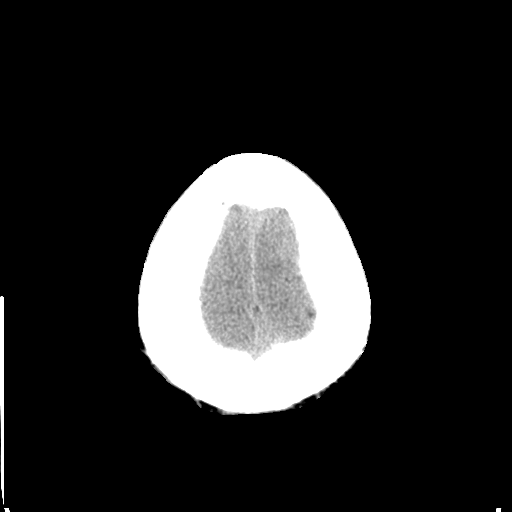

[Series 4: coronal · coronal · 0.29mm/px · 3 of 66 slices shown]
[im 22/66  brain]
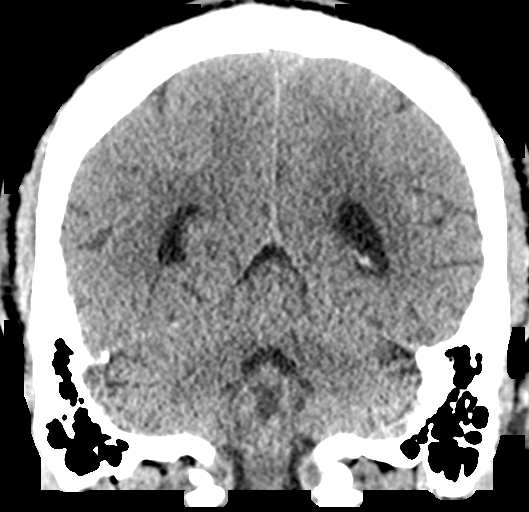
[im 29/66  brain]
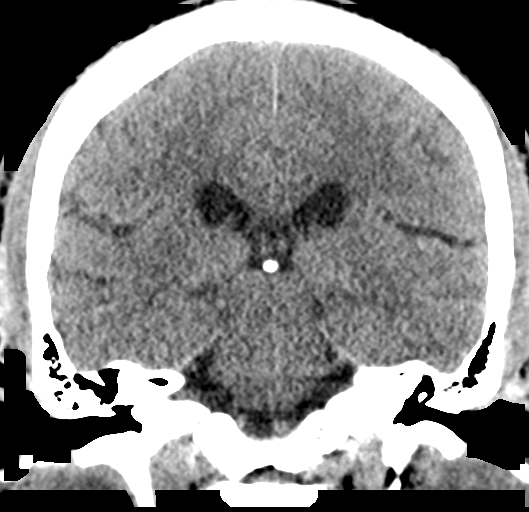
[im 37/66  brain]
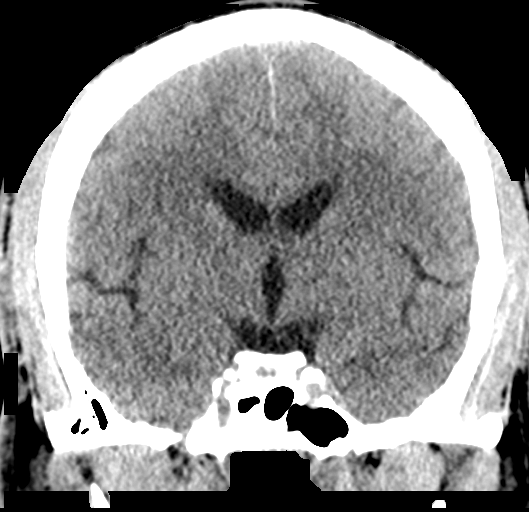

[Series 5: sagittal · sagittal · 0.29mm/px · 3 of 50 slices shown]
[im 17/50  brain]
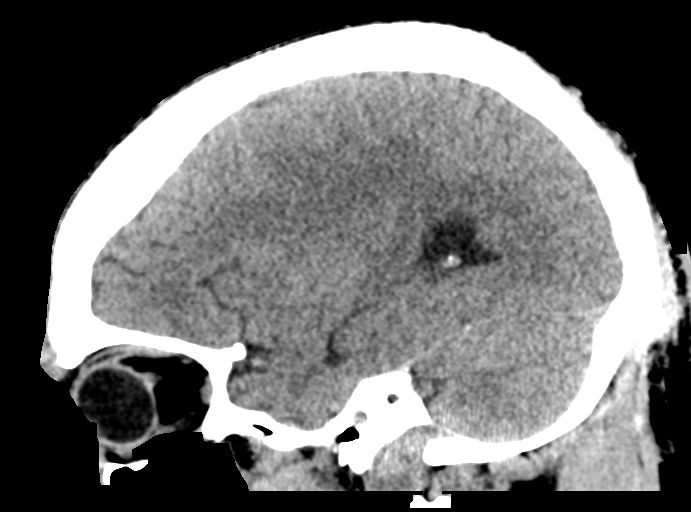
[im 25/50  brain]
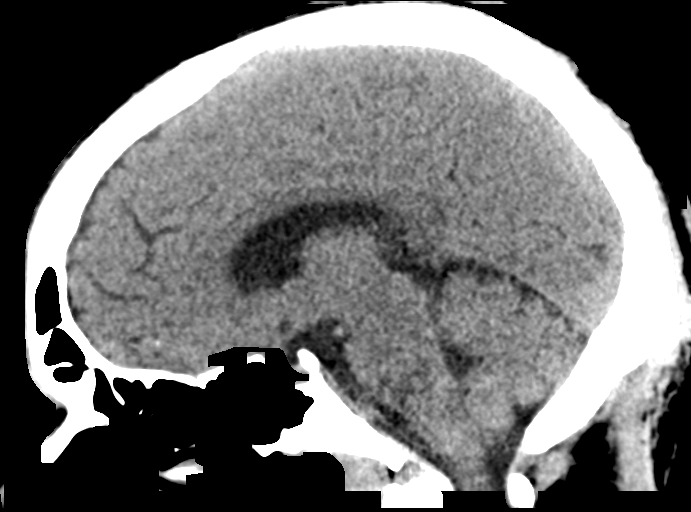
[im 33/50  brain]
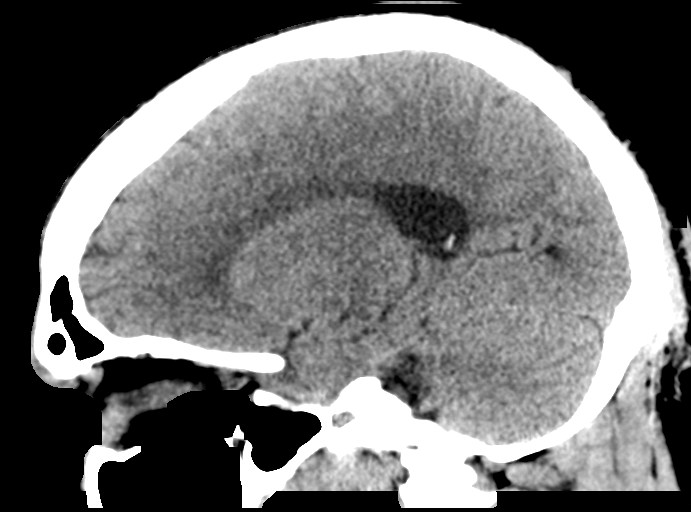

[16 of 47 positions shown; findings below may reference images not displayed]

FINDINGS: Brain: No acute intracranial hemorrhage. No midline shift or mass
effect. Gray-white differentiation maintained. Unremarkable
appearance of the ventricular system.

Vascular: Unremarkable.

Skull: No acute fracture.  No aggressive bone lesion identified.

Sinuses/Orbits: Unremarkable appearance of the orbits. Mastoid air
cells clear. No middle ear effusion. No significant sinus disease.

Other: None
IMPRESSION: No CT evidence of acute intracranial abnormality.
